# Patient Record
Sex: Male | Born: 1970 | Hispanic: No | Marital: Single | Smoking: Never smoker
Health system: Southern US, Community
[De-identification: ages and names within clinical notes are randomized; demographics above are authoritative.]

## PROBLEM LIST (undated history)

## (undated) DIAGNOSIS — K219 Gastro-esophageal reflux disease without esophagitis: Secondary | ICD-10-CM

---

## 2014-06-12 ENCOUNTER — Ambulatory Visit
Admission: RE | Admit: 2014-06-12 | Discharge: 2014-06-12 | Disposition: A | Discharge status: Home / Self Care | Payer: PRIVATE HEALTH INSURANCE | Source: Ambulatory Visit | Attending: Physical Medicine and Rehabilitation | Admitting: Physical Medicine and Rehabilitation

## 2014-06-12 ENCOUNTER — Other Ambulatory Visit: Payer: Self-pay | Admitting: Physical Medicine and Rehabilitation

## 2014-06-12 DIAGNOSIS — Z0000 Encounter for general adult medical examination without abnormal findings: Secondary | ICD-10-CM

## 2016-04-02 IMAGING — CR DG CHEST 2V
2 series · 2 of 2 positions shown · non-contrast
Comparison: None.

CLINICAL DATA: Employment physical.

EXAM:
CHEST  2 VIEW

[view not recorded (1 of 2)]
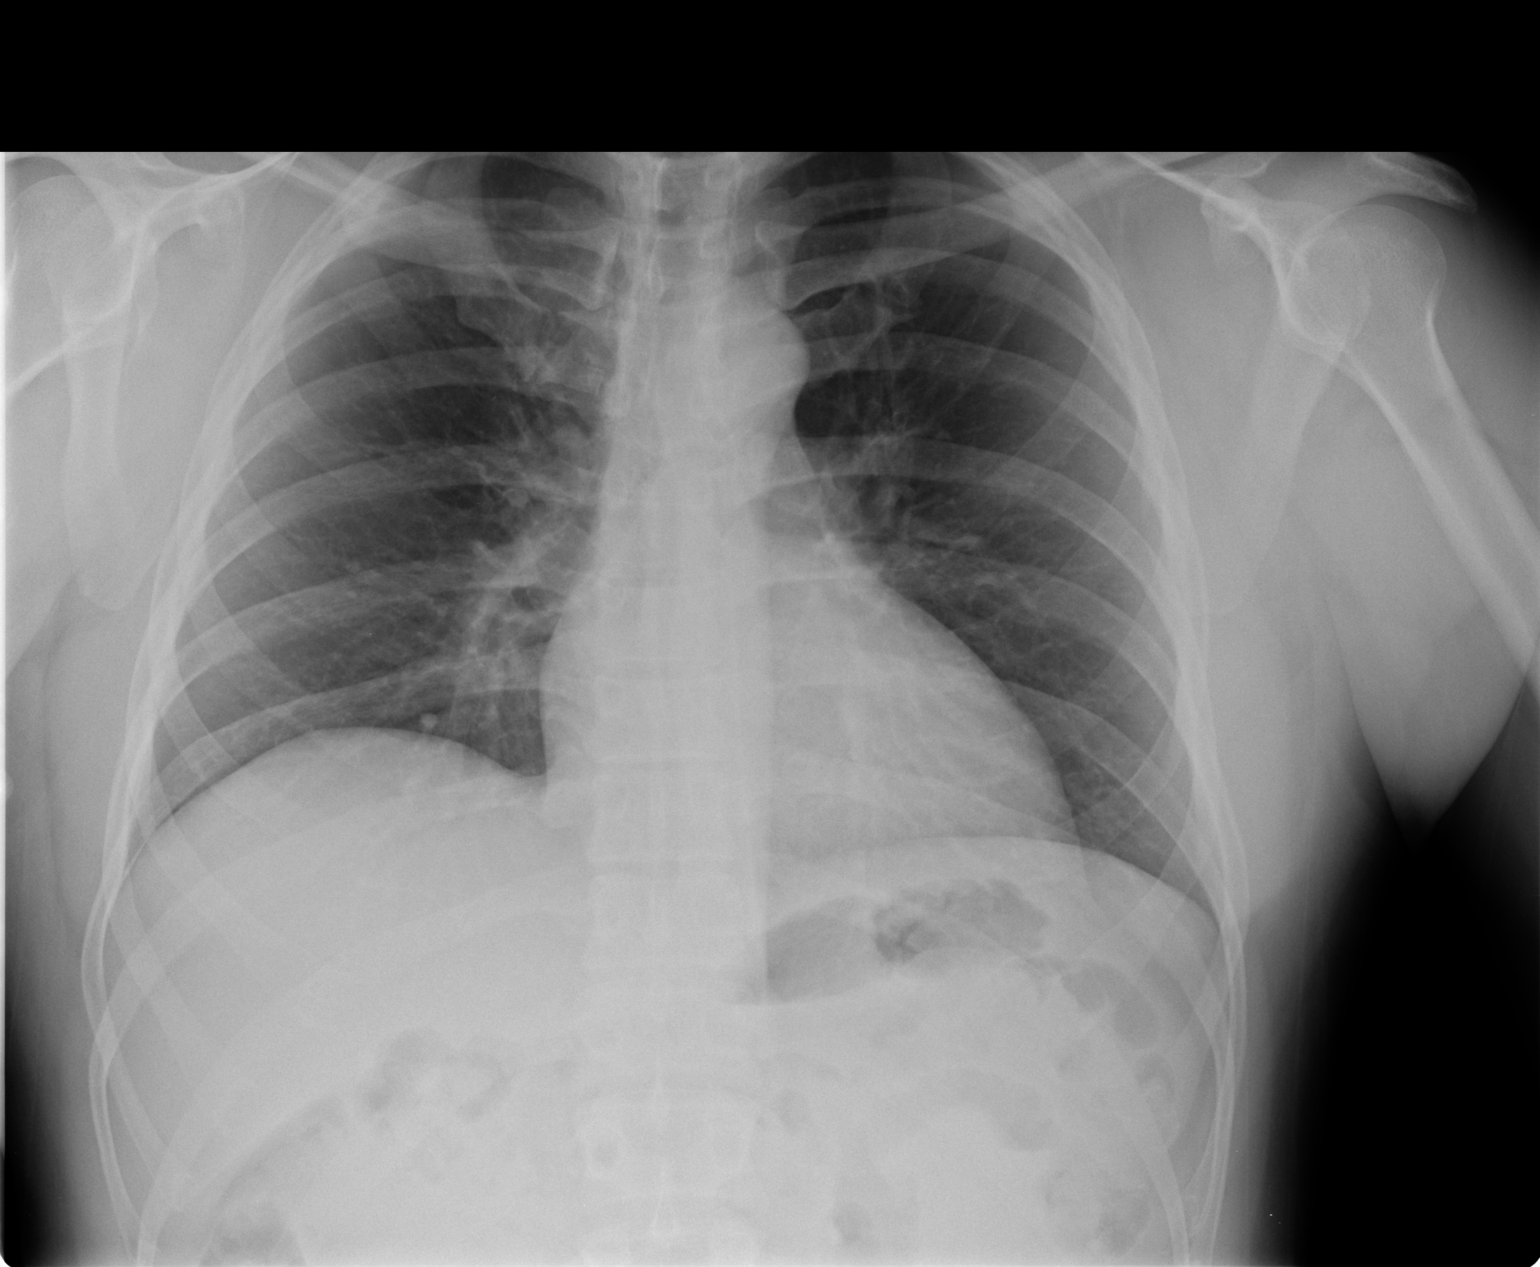

[view not recorded (2 of 2)]
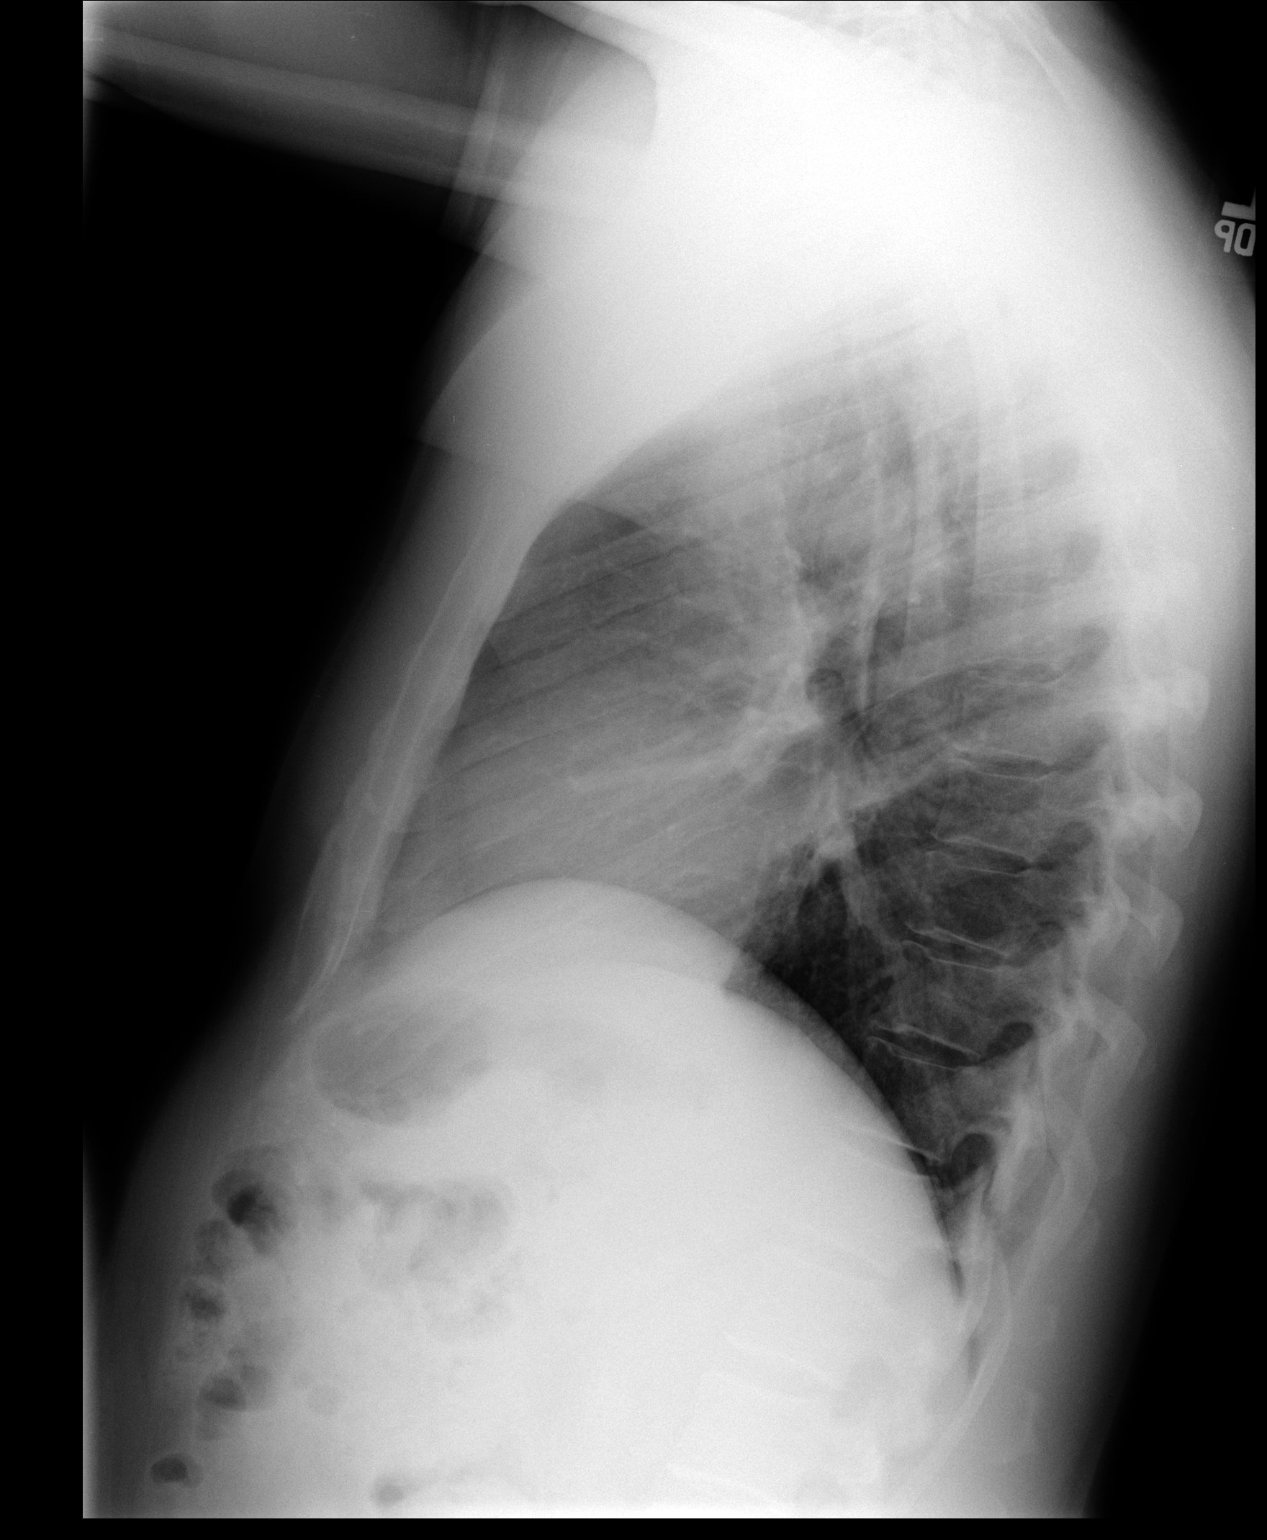

[2 of 2 positions shown; findings below may reference images not displayed]

FINDINGS: The heart size and mediastinal contours are within normal limits.
Both lungs are clear. Old right clavicle fracture. The visualized
skeletal structures are unremarkable.
IMPRESSION: No active cardiopulmonary disease.

## 2017-07-12 ENCOUNTER — Ambulatory Visit (HOSPITAL_COMMUNITY)
Admission: EM | Admit: 2017-07-12 | Discharge: 2017-07-12 | Disposition: A | Discharge status: Home / Self Care | Payer: 59 | Attending: Emergency Medicine | Admitting: Emergency Medicine

## 2017-07-12 ENCOUNTER — Encounter (HOSPITAL_COMMUNITY): Payer: Self-pay | Admitting: Emergency Medicine

## 2017-07-12 DIAGNOSIS — N50819 Testicular pain, unspecified: Secondary | ICD-10-CM

## 2017-07-12 DIAGNOSIS — N453 Epididymo-orchitis: Secondary | ICD-10-CM | POA: Diagnosis not present

## 2017-07-12 DIAGNOSIS — R361 Hematospermia: Secondary | ICD-10-CM

## 2017-07-12 MED ORDER — DOXYCYCLINE HYCLATE 100 MG PO CAPS: 100.0000 mg | ORAL_CAPSULE | Freq: Two times a day (BID) | ORAL | 0 refills | Status: DC

## 2017-07-12 NOTE — Discharge Instructions (Signed)
Read instructions regarding her diagnoses. Take the antibiotic as directed. Elevate the scrotum. Sometimes cold compresses will help with discomfort. For worsening, new symptoms or problems may return or go to the emergency department

## 2017-07-12 NOTE — ED Triage Notes (Signed)
Pt here for right sided testicle pain with some swelling x 3 days

## 2017-07-12 NOTE — ED Provider Notes (Signed)
[facility]    CSN: [id number] Arrival date & time: 07/12/17  1907     History   Chief Complaint Chief Complaint  Patient presents with  . Testicle Pain    HPI Barry Miller is a 46 y.o. male.   46 year old male complaining of pain in the right testicle for 3 days. States it is mildly swollen. In the past couple days he saw small amount of blood in the semen but none since. Denies dysuria or hematuria. Denies trauma.      History reviewed. No pertinent past medical history.  There are no active problems to display for this patient.   History reviewed. No pertinent surgical history.     Home Medications    Prior to Admission medications   Medication Sig Start Date End Date Taking? Authorizing Provider  doxycycline (VIBRAMYCIN) 100 MG capsule Take 1 capsule (100 mg total) 2 (two) times daily by mouth. 07/12/17   Hayden RasmussenMabe, Lisett Dirusso, NP    Family History History reviewed. No pertinent family history.  Social History Social History   Tobacco Use  . Smoking status: Never Smoker  . Smokeless tobacco: Never Used  Substance Use Topics  . Alcohol use: Yes  . Drug use: No     Allergies   Patient has no known allergies.   Review of Systems Review of Systems  Constitutional: Negative.   Genitourinary: Positive for testicular pain. Negative for discharge, dysuria and penile pain.  All other systems reviewed and are negative.    Physical Exam Triage Vital Signs ED Triage Vitals [07/12/17 1933]  Enc Vitals Group     BP (!) 141/98     Pulse Rate 90     Resp 18     Temp 99.4 F (37.4 C)     Temp Source Oral     SpO2 100 %     Weight      Height      Head Circumference      Peak Flow      Pain Score 4     Pain Loc      Pain Edu?      Excl. in GC?    No data found.  Updated Vital Signs BP (!) 141/98 (BP Location: Right Arm)   Pulse 90   Temp 99.4 F (37.4 C) (Oral)   Resp 18   SpO2 100%   Visual Acuity Right Eye Distance:   Left  Eye Distance:   Bilateral Distance:    Right Eye Near:   Left Eye Near:    Bilateral Near:     Physical Exam  Constitutional: He is oriented to person, place, and time. He appears well-developed and well-nourished. No distress.  Eyes: EOM are normal.  Neck: Neck supple.  Cardiovascular: Normal rate.  Pulmonary/Chest: Effort normal. No respiratory distress.  Genitourinary: Penis normal.  Genitourinary Comments: Right testicle enlarged, swollen and tender. Positioned vertically. Tenderness in the posterior and superior portion of the testicle. Left testicle of normal size and configuration and orientation. No tenderness.  Musculoskeletal: He exhibits no edema.  Neurological: He is alert and oriented to person, place, and time. He exhibits normal muscle tone.  Skin: Skin is warm and dry.  Psychiatric: He has a normal mood and affect.  Nursing note and vitals reviewed.    UC Treatments / Results  Labs (all labs ordered are listed, but only abnormal results are displayed) Labs Reviewed - No data to display  EKG  EKG Interpretation None  Radiology No results found.  Procedures Procedures (including critical care time)  Medications Ordered in UC Medications - No data to display   Initial Impression / Assessment and Plan / UC Course  I have reviewed the triage vital signs and the nursing notes.  Pertinent labs & imaging results that were available during my care of the patient were reviewed by me and considered in my medical decision making (see chart for details).     Read instructions regarding her diagnoses. Take the antibiotic as directed. Elevate the scrotum. Sometimes cold compresses will help with discomfort. For worsening, new symptoms or problems may return or go to the emergency department   Final Clinical Impressions(s) / UC Diagnoses   Final diagnoses:  Orchitis and epididymitis    ED Discharge Orders        Ordered    doxycycline (VIBRAMYCIN)  100 MG capsule  2 times daily     07/12/17 2131       Controlled Substance Prescriptions [place] Controlled Substance Registry consulted? Not Applicable   Hayden Rasmussen, NP 07/12/17 2133

## 2017-07-13 LAB — POCT URINALYSIS DIP (DEVICE)
BILIRUBIN URINE: NEGATIVE
Glucose, UA: NEGATIVE mg/dL
Ketones, ur: NEGATIVE mg/dL
NITRITE: NEGATIVE
Protein, ur: 30 mg/dL — AB
Specific Gravity, Urine: >=1.03 (ref 1.005–1.030)
Urobilinogen, UA: 1 mg/dL (ref 0.0–1.0)
pH: 6.5 (ref 5.0–8.0)

## 2021-04-21 DIAGNOSIS — M25511 Pain in right shoulder: Secondary | ICD-10-CM | POA: Insufficient documentation

## 2021-07-09 DIAGNOSIS — M25611 Stiffness of right shoulder, not elsewhere classified: Secondary | ICD-10-CM | POA: Insufficient documentation

## 2022-03-26 ENCOUNTER — Emergency Department (HOSPITAL_COMMUNITY)
Admission: EM | Admit: 2022-03-26 | Discharge: 2022-03-26 | Disposition: A | Discharge status: Home / Self Care | Payer: 59 | Attending: Emergency Medicine | Admitting: Emergency Medicine

## 2022-03-26 ENCOUNTER — Encounter (HOSPITAL_COMMUNITY): Payer: Self-pay

## 2022-03-26 ENCOUNTER — Other Ambulatory Visit: Payer: Self-pay

## 2022-03-26 DIAGNOSIS — R21 Rash and other nonspecific skin eruption: Secondary | ICD-10-CM

## 2022-03-26 DIAGNOSIS — L509 Urticaria, unspecified: Secondary | ICD-10-CM | POA: Diagnosis not present

## 2022-03-26 HISTORY — DX: Gastro-esophageal reflux disease without esophagitis: K21.9

## 2022-03-26 MED ORDER — PREDNISONE 20 MG PO TABS
60.0000 mg | ORAL_TABLET | Freq: Once | ORAL | Status: AC
  Administered 2022-03-26: 60 mg via ORAL
  Filled 2022-03-26: qty 3

## 2022-03-26 MED ORDER — DIPHENHYDRAMINE HCL 25 MG PO TABS: 25.0000 mg | ORAL_TABLET | Freq: Four times a day (QID) | ORAL | 0 refills | Status: DC | PRN

## 2022-03-26 MED ORDER — PREDNISONE 20 MG PO TABS: ORAL_TABLET | ORAL | 0 refills | Status: DC

## 2022-03-26 NOTE — ED Triage Notes (Addendum)
Pt reports with hives to his upper arms and both legs. Pt took a Benadryl last night and it helped. Pt reports working out in the yard yesterday.

## 2022-03-26 NOTE — ED Provider Notes (Signed)
[name]-EMERGENCY DEPT Provider Note   CSN: [id number] Arrival date & time: 03/26/22  9518     History  Chief Complaint  Patient presents with   Urticaria    Barry Miller is a 51 y.o. male.  The history is provided by the patient and medical records.  Urticaria   51 y.o. M presenting to the ED with rash to BUE.  States he was working in the yard today but denies using any new chemicals, bug bites, or tick bites.  He states last night after showering he noticed rash to BUE, pruritic.  He did take benadryl about 1 hour PTA which seemed to help but rash still present.  Denies SOB, difficulty swallowing, sensation of throat closing, etc.  No known allergies.  Home Medications Prior to Admission medications   Medication Sig Start Date End Date Taking? Authorizing Provider  diphenhydrAMINE (BENADRYL) 25 MG tablet Take 1 tablet (25 mg total) by mouth every 6 (six) hours as needed. 03/26/22  Yes Garlon Hatchet, PA-C  predniSONE (DELTASONE) 20 MG tablet Take 40 mg by mouth daily for 3 days, then 20mg  by mouth daily for 3 days, then 10mg  daily for 3 days 03/26/22  Yes , PA-C  doxycycline (VIBRAMYCIN) 100 MG capsule Take 1 capsule (100 mg total) 2 (two) times daily by mouth. 07/12/17   Garlon Hatchet, NP      Allergies    Patient has no known allergies.    Review of Systems   Review of Systems  Skin:  Positive for rash.  All other systems reviewed and are negative.   Physical Exam Updated Vital Signs BP (!) 179/127 (BP Location: Left Arm)   Pulse 85   Temp 98.4 F (36.9 C) (Oral)   Resp 19   Ht 5\' 8"  (1.727 m)   Wt 79.4 kg   SpO2 98%   BMI 26.61 kg/m   Physical Exam Vitals and nursing note reviewed.  Constitutional:      Appearance: He is well-developed.  HENT:     Head: Normocephalic and atraumatic.     Mouth/Throat:     Comments: No lip/tongue swelling, handling secretions well, no stridor Eyes:     Conjunctiva/sclera:  Conjunctivae normal.     Pupils: Pupils are equal, round, and reactive to light.  Cardiovascular:     Rate and Rhythm: Normal rate and regular rhythm.     Heart sounds: Normal heart sounds.  Pulmonary:     Effort: Pulmonary effort is normal.     Breath sounds: Normal breath sounds.  Abdominal:     General: Bowel sounds are normal.     Palpations: Abdomen is soft.  Musculoskeletal:        General: Normal range of motion.     Cervical back: Normal range of motion.  Skin:    General: Skin is warm and dry.     Comments: Urticarial rash noted to BUE, worse along dorsal surfaces, spares palms; no appreciable bug bites/stings  Neurological:     Mental Status: He is alert and oriented to person, place, and time.     ED Results / Procedures / Treatments   Labs (all labs ordered are listed, but only abnormal results are displayed) Labs Reviewed - No data to display  EKG None  Radiology No results found.  Procedures Procedures    Medications Ordered in ED Medications  predniSONE (DELTASONE) tablet 60 mg (60 mg Oral Given 03/26/22 0442)    ED Course/  Medical Decision Making/ A&P                           Medical Decision Making Risk OTC drugs. Prescription drug management.   51 year old male presenting to the ED with rash to upper arms that occurred last night.  Does admit to working in the yard yesterday but denies any new use of chemicals, bug or tick bites.  Did take Benadryl prior to arrival with some improvement but rash still present.  Afebrile, nontoxic.  Urticarial rash noted to bilateral upper extremities, worse along dorsal surfaces.  Rash spares palms.  No lip or tongue swelling, no airway compromise.  No visible bug bites or stings noted.  As she does have Benadryl, will give dose of prednisone, plan to discharge home with same.  Encouraged follow-up with PCP.  Return here for new concerns.  Final Clinical Impression(s) / ED Diagnoses Final diagnoses:  Rash     Rx / DC Orders ED Discharge Orders          Ordered    predniSONE (DELTASONE) 20 MG tablet        03/26/22 0502    diphenhydrAMINE (BENADRYL) 25 MG tablet  Every 6 hours PRN        03/26/22 0502              Garlon Hatchet, PA-C 03/26/22 0515    Geoffery Lyons, MD 03/26/22 [phone]

## 2022-03-26 NOTE — Discharge Instructions (Signed)
Take the prescribed medication as directed. °Follow-up with your primary care doctor. °Return to the ED for new or worsening symptoms. °

## 2022-08-17 ENCOUNTER — Encounter: Payer: Self-pay | Admitting: Emergency Medicine

## 2022-08-17 ENCOUNTER — Ambulatory Visit (INDEPENDENT_AMBULATORY_CARE_PROVIDER_SITE_OTHER): Payer: Self-pay | Admitting: Emergency Medicine

## 2022-08-17 VITALS — BP 178/98 | HR 99 | Temp 98.7°F | Ht 68.0 in | Wt 188.0 lb

## 2022-08-17 DIAGNOSIS — I10 Essential (primary) hypertension: Secondary | ICD-10-CM

## 2022-08-17 DIAGNOSIS — Z7689 Persons encountering health services in other specified circumstances: Secondary | ICD-10-CM

## 2022-08-17 DIAGNOSIS — Z1211 Encounter for screening for malignant neoplasm of colon: Secondary | ICD-10-CM

## 2022-08-17 LAB — COMPREHENSIVE METABOLIC PANEL
ALT: 20 U/L (ref 0–53)
AST: 18 U/L (ref 0–37)
Albumin: 4.5 g/dL (ref 3.5–5.2)
Alkaline Phosphatase: 88 U/L (ref 39–117)
BUN: 12 mg/dL (ref 6–23)
CO2: 31 mEq/L (ref 19–32)
Calcium: 9.5 mg/dL (ref 8.4–10.5)
Chloride: 103 mEq/L (ref 96–112)
Creatinine, Ser: 1.46 mg/dL (ref 0.40–1.50)
GFR: 55.64 mL/min — ABNORMAL LOW (ref >60.00)
Glucose, Bld: 91 mg/dL (ref 70–99)
Potassium: 4.3 mEq/L (ref 3.5–5.1)
Sodium: 139 mEq/L (ref 135–145)
Total Bilirubin: 0.6 mg/dL (ref 0.2–1.2)
Total Protein: 7.4 g/dL (ref 6.0–8.3)

## 2022-08-17 LAB — CBC WITH DIFFERENTIAL/PLATELET
Basophils Absolute: 0 10*3/uL (ref 0.0–0.1)
Basophils Relative: 0.9 % (ref 0.0–3.0)
Eosinophils Absolute: 0 10*3/uL (ref 0.0–0.7)
Eosinophils Relative: 1.4 % (ref 0.0–5.0)
HCT: 47.2 % (ref 39.0–52.0)
Hemoglobin: 16.1 g/dL (ref 13.0–17.0)
Lymphocytes Relative: 46 % (ref 12.0–46.0)
Lymphs Abs: 1.3 10*3/uL (ref 0.7–4.0)
MCHC: 34.1 g/dL (ref 30.0–36.0)
MCV: 91.2 fl (ref 78.0–100.0)
Monocytes Absolute: 0.3 10*3/uL (ref 0.1–1.0)
Monocytes Relative: 10.5 % (ref 3.0–12.0)
Neutro Abs: 1.2 10*3/uL — ABNORMAL LOW (ref 1.4–7.7)
Neutrophils Relative %: 41.2 % — ABNORMAL LOW (ref 43.0–77.0)
Platelets: 223 10*3/uL (ref 150.0–400.0)
RBC: 5.17 Mil/uL (ref 4.22–5.81)
RDW: 13.2 % (ref 11.5–15.5)
WBC: 2.9 10*3/uL — ABNORMAL LOW (ref 4.0–10.5)

## 2022-08-17 LAB — LIPID PANEL
Cholesterol: 251 mg/dL — ABNORMAL HIGH (ref 0–200)
HDL: 57.5 mg/dL (ref >39.00)
LDL Cholesterol: 165 mg/dL — ABNORMAL HIGH (ref 0–99)
NonHDL: 193.31
Total CHOL/HDL Ratio: 4
Triglycerides: 142 mg/dL (ref 0.0–149.0)
VLDL: 28.4 mg/dL (ref 0.0–40.0)

## 2022-08-17 LAB — HEMOGLOBIN A1C: Hgb A1c MFr Bld: 5.1 % (ref 4.6–6.5)

## 2022-08-17 MED ORDER — AMLODIPINE BESYLATE 5 MG PO TABS: 5.0000 mg | ORAL_TABLET | Freq: Every day | ORAL | 3 refills | Status: DC

## 2022-08-17 NOTE — Patient Instructions (Signed)
Hypertension, Adult High blood pressure (hypertension) is when the force of blood pumping through the arteries is too strong. The arteries are the blood vessels that carry blood from the heart throughout the body. Hypertension forces the heart to work harder to pump blood and may cause arteries to become narrow or stiff. Untreated or uncontrolled hypertension can lead to a heart attack, heart failure, a stroke, kidney disease, and other problems. A blood pressure reading consists of a higher number over a lower number. Ideally, your blood pressure should be below 120/80. The first ("top") number is called the systolic pressure. It is a measure of the pressure in your arteries as your heart beats. The second ("bottom") number is called the diastolic pressure. It is a measure of the pressure in your arteries as the heart relaxes. What are the causes? The exact cause of this condition is not known. There are some conditions that result in high blood pressure. What increases the risk? Certain factors may make you more likely to develop high blood pressure. Some of these risk factors are under your control, including: Smoking. Not getting enough exercise or physical activity. Being overweight. Having too much fat, sugar, calories, or salt (sodium) in your diet. Drinking too much alcohol. Other risk factors include: Having a personal history of heart disease, diabetes, high cholesterol, or kidney disease. Stress. Having a family history of high blood pressure and high cholesterol. Having obstructive sleep apnea. Age. The risk increases with age. What are the signs or symptoms? High blood pressure may not cause symptoms. Very high blood pressure (hypertensive crisis) may cause: Headache. Fast or irregular heartbeats (palpitations). Shortness of breath. Nosebleed. Nausea and vomiting. Vision changes. Severe chest pain, dizziness, and seizures. How is this diagnosed? This condition is diagnosed by  measuring your blood pressure while you are seated, with your arm resting on a flat surface, your legs uncrossed, and your feet flat on the floor. The cuff of the blood pressure monitor will be placed directly against the skin of your upper arm at the level of your heart. Blood pressure should be measured at least twice using the same arm. Certain conditions can cause a difference in blood pressure between your right and left arms. If you have a high blood pressure reading during one visit or you have normal blood pressure with other risk factors, you may be asked to: Return on a different day to have your blood pressure checked again. Monitor your blood pressure at home for 1 week or longer. If you are diagnosed with hypertension, you may have other blood or imaging tests to help your health care provider understand your overall risk for other conditions. How is this treated? This condition is treated by making healthy lifestyle changes, such as eating healthy foods, exercising more, and reducing your alcohol intake. You may be referred for counseling on a healthy diet and physical activity. Your health care provider may prescribe medicine if lifestyle changes are not enough to get your blood pressure under control and if: Your systolic blood pressure is above 130. Your diastolic blood pressure is above 80. Your personal target blood pressure may vary depending on your medical conditions, your age, and other factors. Follow these instructions at home: Eating and drinking  Eat a diet that is high in fiber and potassium, and low in sodium, added sugar, and fat. An example of this eating plan is called the DASH diet. DASH stands for Dietary Approaches to Stop Hypertension. To eat this way: Eat   plenty of fresh fruits and vegetables. Try to fill one half of your plate at each meal with fruits and vegetables. Eat whole grains, such as whole-wheat pasta, brown rice, or whole-grain bread. Fill about one  fourth of your plate with whole grains. Eat or drink low-fat dairy products, such as skim milk or low-fat yogurt. Avoid fatty cuts of meat, processed or cured meats, and poultry with skin. Fill about one fourth of your plate with lean proteins, such as fish, chicken without skin, beans, eggs, or tofu. Avoid pre-made and processed foods. These tend to be higher in sodium, added sugar, and fat. Reduce your daily sodium intake. Many people with hypertension should eat less than 1,500 mg of sodium a day. Do not drink alcohol if: Your health care provider tells you not to drink. You are pregnant, may be pregnant, or are planning to become pregnant. If you drink alcohol: Limit how much you have to: 0-1 drink a day for women. 0-2 drinks a day for men. Know how much alcohol is in your drink. In the [place] one drink equals one 12 oz bottle of beer (355 mL), one 5 oz glass of wine (148 mL), or one 1 oz glass of hard liquor (44 mL). Lifestyle  Work with your health care provider to maintain a healthy body weight or to lose weight. Ask what an ideal weight is for you. Get at least 30 minutes of exercise that causes your heart to beat faster (aerobic exercise) most days of the week. Activities may include walking, swimming, or biking. Include exercise to strengthen your muscles (resistance exercise), such as Pilates or lifting weights, as part of your weekly exercise routine. Try to do these types of exercises for 30 minutes at least 3 days a week. Do not use any products that contain nicotine or tobacco. These products include cigarettes, chewing tobacco, and vaping devices, such as e-cigarettes. If you need help quitting, ask your health care provider. Monitor your blood pressure at home as told by your health care provider. Keep all follow-up visits. This is important. Medicines Take over-the-counter and prescription medicines only as told by your health care provider. Follow directions carefully. Blood  pressure medicines must be taken as prescribed. Do not skip doses of blood pressure medicine. Doing this puts you at risk for problems and can make the medicine less effective. Ask your health care provider about side effects or reactions to medicines that you should watch for. Contact a health care provider if you: Think you are having a reaction to a medicine you are taking. Have headaches that keep coming back (recurring). Feel dizzy. Have swelling in your ankles. Have trouble with your vision. Get help right away if you: Develop a severe headache or confusion. Have unusual weakness or numbness. Feel faint. Have severe pain in your chest or abdomen. Vomit repeatedly. Have trouble breathing. These symptoms may be an emergency. Get help right away. Call 911. Do not wait to see if the symptoms will go away. Do not drive yourself to the hospital. Summary Hypertension is when the force of blood pumping through your arteries is too strong. If this condition is not controlled, it may put you at risk for serious complications. Your personal target blood pressure may vary depending on your medical conditions, your age, and other factors. For most people, a normal blood pressure is less than 120/80. Hypertension is treated with lifestyle changes, medicines, or a combination of both. Lifestyle changes include losing weight, eating a healthy,   low-sodium diet, exercising more, and limiting alcohol. This information is not intended to replace advice given to you by your health care provider. Make sure you discuss any questions you have with your health care provider. Document Revised: 07/01/2021 Document Reviewed: 07/01/2021 Elsevier Patient Education  2023 [employer]  

## 2022-08-17 NOTE — Assessment & Plan Note (Signed)
Elevated blood pressure readings in the office and at home. Cardio vascular risks associated with uncontrolled hypertension discussed.  Family history of hypertension. Dietary approaches to stop hypertension discussed. Recommend to start amlodipine 5 mg daily. Advised to continue monitoring blood pressure readings at home daily for the next several weeks and keep a log.  Advised to contact the office if numbers persistently abnormal. Follow-up in 3 months.  Earlier as needed.

## 2022-08-17 NOTE — Progress Notes (Signed)
Barry ReddishAlexander Ciccarelli 51 y.o.   Chief Complaint  Patient presents with   New patient    No concerns or questions    HISTORY OF PRESENT ILLNESS: This is a 51 y.o. male A1A first visit to this office, here to establish care with me. Has noticed blood pressure readings persistently high at home. Family history of hypertension. Non-smoker. No other complaints or medical concerns today. BP Readings from Last 3 Encounters:  08/17/22 (!) 178/98  03/26/22 (!) 179/127  07/12/17 (!) 141/98     HPI   Prior to Admission medications   Not on File    No Known Allergies  There are no problems to display for this patient.   Past Medical History:  Diagnosis Date   GERD (gastroesophageal reflux disease)     No past surgical history on file.  Social History   Socioeconomic History   Marital status: Single    Spouse name: Not on file   Number of children: Not on file   Years of education: Not on file   Highest education level: Not on file  Occupational History   Not on file  Tobacco Use   Smoking status: Never   Smokeless tobacco: Never  Substance and Sexual Activity   Alcohol use: Yes   Drug use: No   Sexual activity: Not on file  Other Topics Concern   Not on file  Social History Narrative   Not on file   Social Determinants of Health   Financial Resource Strain: Not on file  Food Insecurity: Not on file  Transportation Needs: Not on file  Physical Activity: Not on file  Stress: Not on file  Social Connections: Not on file  Intimate Partner Violence: Not on file    No family history on file.   Review of Systems  Constitutional: Negative.   HENT: Negative.  Negative for congestion and sore throat.   Eyes: Negative.   Respiratory: Negative.  Negative for cough and shortness of breath.   Cardiovascular: Negative.  Negative for chest pain and palpitations.  Gastrointestinal:  Positive for heartburn. Negative for abdominal pain, diarrhea, nausea and vomiting.   Genitourinary: Negative.   Skin: Negative.  Negative for rash.  Neurological: Negative.  Negative for dizziness and headaches.  All other systems reviewed and are negative.   Today's Vitals   08/17/22 1448 08/17/22 1455  BP: (!) 182/108 (!) 178/98  Pulse: 99   Temp: 98.7 F (37.1 C)   TempSrc: Oral   SpO2: 96%   Weight: 188 lb (85.3 kg)   Height: 5\' 8"  (1.727 m)    Body mass index is 28.59 kg/m.  Physical Exam Vitals reviewed.  Constitutional:      Appearance: Normal appearance.  HENT:     Head: Normocephalic.     Mouth/Throat:     Mouth: Mucous membranes are moist.     Pharynx: Oropharynx is clear.  Eyes:     Extraocular Movements: Extraocular movements intact.     Conjunctiva/sclera: Conjunctivae normal.     Pupils: Pupils are equal, round, and reactive to light.  Cardiovascular:     Rate and Rhythm: Normal rate and regular rhythm.     Pulses: Normal pulses.     Heart sounds: Normal heart sounds.  Pulmonary:     Effort: Pulmonary effort is normal.     Breath sounds: Normal breath sounds.  Abdominal:     Palpations: Abdomen is soft.     Tenderness: There is no abdominal tenderness.  Musculoskeletal:  Cervical back: No tenderness.  Lymphadenopathy:     Cervical: No cervical adenopathy.  Skin:    General: Skin is warm and dry.     Capillary Refill: Capillary refill takes less than 2 seconds.  Neurological:     General: No focal deficit present.     Mental Status: He is alert and oriented to person, place, and time.  Psychiatric:        Mood and Affect: Mood normal.        Behavior: Behavior normal.      ASSESSMENT & PLAN: A total of 47 minutes was spent with the patient and counseling/coordination of care regarding preparing for this visit, establishing care with me, review of available medical records, diagnosis of hypertension and cardiovascular risks associated with this condition, education on nutrition, need for blood work today, need to start  antihypertensive medication today, prognosis, documentation, need for follow-up  Problem List Items Addressed This Visit       Cardiovascular and Mediastinum   Essential hypertension - Primary    Elevated blood pressure readings in the office and at home. Cardio vascular risks associated with uncontrolled hypertension discussed.  Family history of hypertension. Dietary approaches to stop hypertension discussed. Recommend to start amlodipine 5 mg daily. Advised to continue monitoring blood pressure readings at home daily for the next several weeks and keep a log.  Advised to contact the office if numbers persistently abnormal. Follow-up in 3 months.  Earlier as needed.      Relevant Medications   amLODipine (NORVASC) 5 MG tablet   Other Relevant Orders   CBC with Differential/Platelet   Comprehensive metabolic panel   Hemoglobin A1c   Lipid panel   Other Visit Diagnoses     Colon cancer screening       Relevant Orders   Cologuard   Encounter to establish care          Patient Instructions  Hypertension, Adult High blood pressure (hypertension) is when the force of blood pumping through the arteries is too strong. The arteries are the blood vessels that carry blood from the heart throughout the body. Hypertension forces the heart to work harder to pump blood and may cause arteries to become narrow or stiff. Untreated or uncontrolled hypertension can lead to a heart attack, heart failure, a stroke, kidney disease, and other problems. A blood pressure reading consists of a higher number over a lower number. Ideally, your blood pressure should be below 120/80. The first ("top") number is called the systolic pressure. It is a measure of the pressure in your arteries as your heart beats. The second ("bottom") number is called the diastolic pressure. It is a measure of the pressure in your arteries as the heart relaxes. What are the causes? The exact cause of this condition is not known.  There are some conditions that result in high blood pressure. What increases the risk? Certain factors may make you more likely to develop high blood pressure. Some of these risk factors are under your control, including: Smoking. Not getting enough exercise or physical activity. Being overweight. Having too much fat, sugar, calories, or salt (sodium) in your diet. Drinking too much alcohol. Other risk factors include: Having a personal history of heart disease, diabetes, high cholesterol, or kidney disease. Stress. Having a family history of high blood pressure and high cholesterol. Having obstructive sleep apnea. Age. The risk increases with age. What are the signs or symptoms? High blood pressure may not cause symptoms. Very high  blood pressure (hypertensive crisis) may cause: Headache. Fast or irregular heartbeats (palpitations). Shortness of breath. Nosebleed. Nausea and vomiting. Vision changes. Severe chest pain, dizziness, and seizures. How is this diagnosed? This condition is diagnosed by measuring your blood pressure while you are seated, with your arm resting on a flat surface, your legs uncrossed, and your feet flat on the floor. The cuff of the blood pressure monitor will be placed directly against the skin of your upper arm at the level of your heart. Blood pressure should be measured at least twice using the same arm. Certain conditions can cause a difference in blood pressure between your right and left arms. If you have a high blood pressure reading during one visit or you have normal blood pressure with other risk factors, you may be asked to: Return on a different day to have your blood pressure checked again. Monitor your blood pressure at home for 1 week or longer. If you are diagnosed with hypertension, you may have other blood or imaging tests to help your health care provider understand your overall risk for other conditions. How is this treated? This condition  is treated by making healthy lifestyle changes, such as eating healthy foods, exercising more, and reducing your alcohol intake. You may be referred for counseling on a healthy diet and physical activity. Your health care provider may prescribe medicine if lifestyle changes are not enough to get your blood pressure under control and if: Your systolic blood pressure is above 130. Your diastolic blood pressure is above 80. Your personal target blood pressure may vary depending on your medical conditions, your age, and other factors. Follow these instructions at home: Eating and drinking  Eat a diet that is high in fiber and potassium, and low in sodium, added sugar, and fat. An example of this eating plan is called the DASH diet. DASH stands for Dietary Approaches to Stop Hypertension. To eat this way: Eat plenty of fresh fruits and vegetables. Try to fill one half of your plate at each meal with fruits and vegetables. Eat whole grains, such as whole-wheat pasta, brown rice, or whole-grain bread. Fill about one fourth of your plate with whole grains. Eat or drink low-fat dairy products, such as skim milk or low-fat yogurt. Avoid fatty cuts of meat, processed or cured meats, and poultry with skin. Fill about one fourth of your plate with lean proteins, such as fish, chicken without skin, beans, eggs, or tofu. Avoid pre-made and processed foods. These tend to be higher in sodium, added sugar, and fat. Reduce your daily sodium intake. Many people with hypertension should eat less than 1,500 mg of sodium a day. Do not drink alcohol if: Your health care provider tells you not to drink. You are pregnant, may be pregnant, or are planning to become pregnant. If you drink alcohol: Limit how much you have to: 0-1 drink a day for women. 0-2 drinks a day for men. Know how much alcohol is in your drink. In the [place] one drink equals one 12 oz bottle of beer (355 mL), one 5 oz glass of wine (148 mL), or one 1  oz glass of hard liquor (44 mL). Lifestyle  Work with your health care provider to maintain a healthy body weight or to lose weight. Ask what an ideal weight is for you. Get at least 30 minutes of exercise that causes your heart to beat faster (aerobic exercise) most days of the week. Activities may include walking, swimming, or biking. Include  exercise to strengthen your muscles (resistance exercise), such as Pilates or lifting weights, as part of your weekly exercise routine. Try to do these types of exercises for 30 minutes at least 3 days a week. Do not use any products that contain nicotine or tobacco. These products include cigarettes, chewing tobacco, and vaping devices, such as e-cigarettes. If you need help quitting, ask your health care provider. Monitor your blood pressure at home as told by your health care provider. Keep all follow-up visits. This is important. Medicines Take over-the-counter and prescription medicines only as told by your health care provider. Follow directions carefully. Blood pressure medicines must be taken as prescribed. Do not skip doses of blood pressure medicine. Doing this puts you at risk for problems and can make the medicine less effective. Ask your health care provider about side effects or reactions to medicines that you should watch for. Contact a health care provider if you: Think you are having a reaction to a medicine you are taking. Have headaches that keep coming back (recurring). Feel dizzy. Have swelling in your ankles. Have trouble with your vision. Get help right away if you: Develop a severe headache or confusion. Have unusual weakness or numbness. Feel faint. Have severe pain in your chest or abdomen. Vomit repeatedly. Have trouble breathing. These symptoms may be an emergency. Get help right away. Call 911. Do not wait to see if the symptoms will go away. Do not drive yourself to the hospital. Summary Hypertension is when the  force of blood pumping through your arteries is too strong. If this condition is not controlled, it may put you at risk for serious complications. Your personal target blood pressure may vary depending on your medical conditions, your age, and other factors. For most people, a normal blood pressure is less than 120/80. Hypertension is treated with lifestyle changes, medicines, or a combination of both. Lifestyle changes include losing weight, eating a healthy, low-sodium diet, exercising more, and limiting alcohol. This information is not intended to replace advice given to you by your health care provider. Make sure you discuss any questions you have with your health care provider. Document Revised: 07/01/2021 Document Reviewed: 07/01/2021 Elsevier Patient Education  2023 [employer].     Edwina Barth, MD [facility]

## 2022-08-18 ENCOUNTER — Other Ambulatory Visit: Payer: Self-pay | Admitting: Emergency Medicine

## 2022-08-18 DIAGNOSIS — E785 Hyperlipidemia, unspecified: Secondary | ICD-10-CM

## 2022-08-18 MED ORDER — ROSUVASTATIN CALCIUM 10 MG PO TABS: 10.0000 mg | ORAL_TABLET | Freq: Every day | ORAL | 3 refills | Status: AC

## 2022-09-08 LAB — COLOGUARD: COLOGUARD: NEGATIVE

## 2023-01-27 ENCOUNTER — Encounter: Payer: Self-pay | Admitting: Emergency Medicine

## 2023-01-27 ENCOUNTER — Ambulatory Visit (INDEPENDENT_AMBULATORY_CARE_PROVIDER_SITE_OTHER): Payer: BC Managed Care – PPO

## 2023-01-27 ENCOUNTER — Ambulatory Visit: Payer: BC Managed Care – PPO | Admitting: Emergency Medicine

## 2023-01-27 VITALS — BP 138/86 | HR 95 | Temp 98.3°F | Ht 68.0 in | Wt 196.1 lb

## 2023-01-27 DIAGNOSIS — R053 Chronic cough: Secondary | ICD-10-CM | POA: Diagnosis not present

## 2023-01-27 DIAGNOSIS — E785 Hyperlipidemia, unspecified: Secondary | ICD-10-CM | POA: Diagnosis not present

## 2023-01-27 DIAGNOSIS — D709 Neutropenia, unspecified: Secondary | ICD-10-CM

## 2023-01-27 DIAGNOSIS — I10 Essential (primary) hypertension: Secondary | ICD-10-CM

## 2023-01-27 DIAGNOSIS — R944 Abnormal results of kidney function studies: Secondary | ICD-10-CM

## 2023-01-27 DIAGNOSIS — R059 Cough, unspecified: Secondary | ICD-10-CM | POA: Diagnosis not present

## 2023-01-27 LAB — LIPID PANEL
Cholesterol: 251 mg/dL — ABNORMAL HIGH (ref 0–200)
HDL: 48.3 mg/dL (ref >39.00)
NonHDL: 202.49
Total CHOL/HDL Ratio: 5
Triglycerides: 257 mg/dL — ABNORMAL HIGH (ref 0.0–149.0)
VLDL: 51.4 mg/dL — ABNORMAL HIGH (ref 0.0–40.0)

## 2023-01-27 LAB — COMPREHENSIVE METABOLIC PANEL
ALT: 29 U/L (ref 0–53)
AST: 21 U/L (ref 0–37)
Albumin: 4.3 g/dL (ref 3.5–5.2)
Alkaline Phosphatase: 86 U/L (ref 39–117)
BUN: 14 mg/dL (ref 6–23)
CO2: 28 mEq/L (ref 19–32)
Calcium: 9.2 mg/dL (ref 8.4–10.5)
Chloride: 102 mEq/L (ref 96–112)
Creatinine, Ser: 1.06 mg/dL (ref 0.40–1.50)
GFR: 81.45 mL/min (ref >60.00)
Glucose, Bld: 105 mg/dL — ABNORMAL HIGH (ref 70–99)
Potassium: 4 mEq/L (ref 3.5–5.1)
Sodium: 137 mEq/L (ref 135–145)
Total Bilirubin: 0.4 mg/dL (ref 0.2–1.2)
Total Protein: 7.1 g/dL (ref 6.0–8.3)

## 2023-01-27 LAB — CBC WITH DIFFERENTIAL/PLATELET
Basophils Absolute: 0 10*3/uL (ref 0.0–0.1)
Basophils Relative: 0.5 % (ref 0.0–3.0)
Eosinophils Absolute: 0.1 10*3/uL (ref 0.0–0.7)
Eosinophils Relative: 2.1 % (ref 0.0–5.0)
HCT: 41.9 % (ref 39.0–52.0)
Hemoglobin: 13.9 g/dL (ref 13.0–17.0)
Lymphocytes Relative: 44.7 % (ref 12.0–46.0)
Lymphs Abs: 1.5 10*3/uL (ref 0.7–4.0)
MCHC: 33.1 g/dL (ref 30.0–36.0)
MCV: 91.8 fl (ref 78.0–100.0)
Monocytes Absolute: 0.4 10*3/uL (ref 0.1–1.0)
Monocytes Relative: 10.8 % (ref 3.0–12.0)
Neutro Abs: 1.4 10*3/uL (ref 1.4–7.7)
Neutrophils Relative %: 41.9 % — ABNORMAL LOW (ref 43.0–77.0)
Platelets: 229 10*3/uL (ref 150.0–400.0)
RBC: 4.57 Mil/uL (ref 4.22–5.81)
RDW: 13.3 % (ref 11.5–15.5)
WBC: 3.3 10*3/uL — ABNORMAL LOW (ref 4.0–10.5)

## 2023-01-27 LAB — LDL CHOLESTEROL, DIRECT: Direct LDL: 156 mg/dL

## 2023-01-27 MED ORDER — VALSARTAN-HYDROCHLOROTHIAZIDE 160-12.5 MG PO TABS: 1.0000 | ORAL_TABLET | Freq: Every day | ORAL | 3 refills | Status: DC

## 2023-01-27 NOTE — Assessment & Plan Note (Signed)
Uncertain etiology.  Non-smoker.  No history of asthma. Normal chest x-ray done today. Patient thinks this is secondary to amlodipine. Blood pressure medication changed today.

## 2023-01-27 NOTE — Assessment & Plan Note (Signed)
Abnormal lipid profile 6 months ago Not taking rosuvastatin.  States medication made his blood pressure go up Lipid profile repeated today Diet and nutrition discussed Benefits of exercise discussed

## 2023-01-27 NOTE — Patient Instructions (Signed)
Stop amlodipine Start valsartan HCT Continue monitoring blood pressure readings at home daily for the next several weeks and keep a log  Hypertension, Adult High blood pressure (hypertension) is when the force of blood pumping through the arteries is too strong. The arteries are the blood vessels that carry blood from the heart throughout the body. Hypertension forces the heart to work harder to pump blood and may cause arteries to become narrow or stiff. Untreated or uncontrolled hypertension can lead to a heart attack, heart failure, a stroke, kidney disease, and other problems. A blood pressure reading consists of a higher number over a lower number. Ideally, your blood pressure should be below 120/80. The first ("top") number is called the systolic pressure. It is a measure of the pressure in your arteries as your heart beats. The second ("bottom") number is called the diastolic pressure. It is a measure of the pressure in your arteries as the heart relaxes. What are the causes? The exact cause of this condition is not known. There are some conditions that result in high blood pressure. What increases the risk? Certain factors may make you more likely to develop high blood pressure. Some of these risk factors are under your control, including: Smoking. Not getting enough exercise or physical activity. Being overweight. Having too much fat, sugar, calories, or salt (sodium) in your diet. Drinking too much alcohol. Other risk factors include: Having a personal history of heart disease, diabetes, high cholesterol, or kidney disease. Stress. Having a family history of high blood pressure and high cholesterol. Having obstructive sleep apnea. Age. The risk increases with age. What are the signs or symptoms? High blood pressure may not cause symptoms. Very high blood pressure (hypertensive crisis) may cause: Headache. Fast or irregular heartbeats (palpitations). Shortness of  breath. Nosebleed. Nausea and vomiting. Vision changes. Severe chest pain, dizziness, and seizures. How is this diagnosed? This condition is diagnosed by measuring your blood pressure while you are seated, with your arm resting on a flat surface, your legs uncrossed, and your feet flat on the floor. The cuff of the blood pressure monitor will be placed directly against the skin of your upper arm at the level of your heart. Blood pressure should be measured at least twice using the same arm. Certain conditions can cause a difference in blood pressure between your right and left arms. If you have a high blood pressure reading during one visit or you have normal blood pressure with other risk factors, you may be asked to: Return on a different day to have your blood pressure checked again. Monitor your blood pressure at home for 1 week or longer. If you are diagnosed with hypertension, you may have other blood or imaging tests to help your health care provider understand your overall risk for other conditions. How is this treated? This condition is treated by making healthy lifestyle changes, such as eating healthy foods, exercising more, and reducing your alcohol intake. You may be referred for counseling on a healthy diet and physical activity. Your health care provider may prescribe medicine if lifestyle changes are not enough to get your blood pressure under control and if: Your systolic blood pressure is above 130. Your diastolic blood pressure is above 80. Your personal target blood pressure may vary depending on your medical conditions, your age, and other factors. Follow these instructions at home: Eating and drinking  Eat a diet that is high in fiber and potassium, and low in sodium, added sugar, and fat. An  example of this eating plan is called the DASH diet. DASH stands for Dietary Approaches to Stop Hypertension. To eat this way: Eat plenty of fresh fruits and vegetables. Try to fill  one half of your plate at each meal with fruits and vegetables. Eat whole grains, such as whole-wheat pasta, brown rice, or whole-grain bread. Fill about one fourth of your plate with whole grains. Eat or drink low-fat dairy products, such as skim milk or low-fat yogurt. Avoid fatty cuts of meat, processed or cured meats, and poultry with skin. Fill about one fourth of your plate with lean proteins, such as fish, chicken without skin, beans, eggs, or tofu. Avoid pre-made and processed foods. These tend to be higher in sodium, added sugar, and fat. Reduce your daily sodium intake. Many people with hypertension should eat less than 1,500 mg of sodium a day. Do not drink alcohol if: Your health care provider tells you not to drink. You are pregnant, may be pregnant, or are planning to become pregnant. If you drink alcohol: Limit how much you have to: 0-1 drink a day for women. 0-2 drinks a day for men. Know how much alcohol is in your drink. In the [place] one drink equals one 12 oz bottle of beer (355 mL), one 5 oz glass of wine (148 mL), or one 1 oz glass of hard liquor (44 mL). Lifestyle  Work with your health care provider to maintain a healthy body weight or to lose weight. Ask what an ideal weight is for you. Get at least 30 minutes of exercise that causes your heart to beat faster (aerobic exercise) most days of the week. Activities may include walking, swimming, or biking. Include exercise to strengthen your muscles (resistance exercise), such as Pilates or lifting weights, as part of your weekly exercise routine. Try to do these types of exercises for 30 minutes at least 3 days a week. Do not use any products that contain nicotine or tobacco. These products include cigarettes, chewing tobacco, and vaping devices, such as e-cigarettes. If you need help quitting, ask your health care provider. Monitor your blood pressure at home as told by your health care provider. Keep all follow-up visits.  This is important. Medicines Take over-the-counter and prescription medicines only as told by your health care provider. Follow directions carefully. Blood pressure medicines must be taken as prescribed. Do not skip doses of blood pressure medicine. Doing this puts you at risk for problems and can make the medicine less effective. Ask your health care provider about side effects or reactions to medicines that you should watch for. Contact a health care provider if you: Think you are having a reaction to a medicine you are taking. Have headaches that keep coming back (recurring). Feel dizzy. Have swelling in your ankles. Have trouble with your vision. Get help right away if you: Develop a severe headache or confusion. Have unusual weakness or numbness. Feel faint. Have severe pain in your chest or abdomen. Vomit repeatedly. Have trouble breathing. These symptoms may be an emergency. Get help right away. Call 911. Do not wait to see if the symptoms will go away. Do not drive yourself to the hospital. Summary Hypertension is when the force of blood pumping through your arteries is too strong. If this condition is not controlled, it may put you at risk for serious complications. Your personal target blood pressure may vary depending on your medical conditions, your age, and other factors. For most people, a normal blood pressure is  less than 120/80. Hypertension is treated with lifestyle changes, medicines, or a combination of both. Lifestyle changes include losing weight, eating a healthy, low-sodium diet, exercising more, and limiting alcohol. This information is not intended to replace advice given to you by your health care provider. Make sure you discuss any questions you have with your health care provider. Document Revised: 07/01/2021 Document Reviewed: 07/01/2021 Elsevier Patient Education  2023 [employer].

## 2023-01-27 NOTE — Assessment & Plan Note (Signed)
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

## 2023-01-27 NOTE — Progress Notes (Unsigned)
Barry Miller 52 y.o.   Chief Complaint  Patient presents with   Medical Management of Chronic Issues    F/u appt, patient has a dry cough for about a few months     HISTORY OF PRESENT ILLNESS: This is a 52 y.o. male here for follow-up of tremor and dyslipidemia Blood pressure numbers doing okay.  However patient thinks amlodipine is causing chronic cough Complaining of dry cough for the past 2 months.  Non-smoker.  No history of asthma. Also states that rosuvastatin made blood pressure go up.  Not taking it at present time No other complaints or medical concerns today. BP Readings from Last 3 Encounters:  01/27/23 138/86  08/17/22 (!) 178/98  03/26/22 (!) 179/127     HPI   Prior to Admission medications   Medication Sig Start Date End Date Taking? Authorizing Provider  amLODipine (NORVASC) 5 MG tablet Take 1 tablet (5 mg total) by mouth daily. 08/17/22  Yes Christino Mcglinchey, Eilleen Kempf, MD  rosuvastatin (CRESTOR) 10 MG tablet Take 1 tablet (10 mg total) by mouth daily. Patient not taking: Reported on 01/27/2023 08/18/22   Georgina Quint, MD    No Known Allergies  Patient Active Problem List   Diagnosis Date Noted   Dyslipidemia 08/18/2022   Essential hypertension 08/17/2022    Past Medical History:  Diagnosis Date   GERD (gastroesophageal reflux disease)     No past surgical history on file.  Social History   Socioeconomic History   Marital status: Single    Spouse name: Not on file   Number of children: Not on file   Years of education: Not on file   Highest education level: Not on file  Occupational History   Not on file  Tobacco Use   Smoking status: Never   Smokeless tobacco: Never  Substance and Sexual Activity   Alcohol use: Yes   Drug use: No   Sexual activity: Not on file  Other Topics Concern   Not on file  Social History Narrative   Not on file   Social Determinants of Health   Financial Resource Strain: Not on file  Food  Insecurity: Not on file  Transportation Needs: Not on file  Physical Activity: Not on file  Stress: Not on file  Social Connections: Not on file  Intimate Partner Violence: Not on file    No family history on file.   Review of Systems  Constitutional: Negative.  Negative for chills and fever.  HENT: Negative.  Negative for congestion and sore throat.   Respiratory:  Positive for cough.   Cardiovascular: Negative.  Negative for chest pain and palpitations.  Gastrointestinal:  Negative for abdominal pain, diarrhea, heartburn, nausea and vomiting.  Genitourinary: Negative.  Negative for dysuria and hematuria.  Skin: Negative.   Neurological: Negative.  Negative for dizziness and headaches.    Vitals:   01/27/23 1552  BP: 138/86  Pulse: 95  Temp: 98.3 F (36.8 C)  SpO2: 95%    Physical Exam Vitals reviewed.  Constitutional:      Appearance: Normal appearance.  HENT:     Head: Normocephalic.  Eyes:     Extraocular Movements: Extraocular movements intact.     Pupils: Pupils are equal, round, and reactive to light.  Cardiovascular:     Rate and Rhythm: Normal rate and regular rhythm.     Pulses: Normal pulses.     Heart sounds: Normal heart sounds.  Pulmonary:     Effort: Pulmonary effort is normal.  Breath sounds: Normal breath sounds.  Musculoskeletal:     Cervical back: No tenderness.  Lymphadenopathy:     Cervical: No cervical adenopathy.  Skin:    General: Skin is warm and dry.     Capillary Refill: Capillary refill takes less than 2 seconds.  Neurological:     Mental Status: He is alert and oriented to person, place, and time.  Psychiatric:        Mood and Affect: Mood normal.        Behavior: Behavior normal.    DG Chest 2 View  Result Date: 01/27/2023 CLINICAL DATA:  Dry cough for 2 months. EXAM: CHEST - 2 VIEW COMPARISON:  06/12/2014 FINDINGS: Heart size and mediastinal contours are unremarkable. Asymmetric elevation of the right hemidiaphragm. No  pleural effusion or interstitial edema. No airspace disease. Remote healed right clavicle fracture. No acute osseous findings. IMPRESSION: 1. No acute cardiopulmonary abnormalities. 2. Asymmetric elevation of right hemidiaphragm. Electronically Signed   By: Signa Kell M.D.   On: 01/27/2023 16:32     ASSESSMENT & PLAN: A total of 45 minutes was spent with the patient and counseling/coordination of care regarding preparing for this visit, review of most recent office visit notes, review of most recent blood work results, diagnosis of hypertension and cardiovascular risk associated with this condition, need for repeat blood work today, education on nutrition, review of chest x-ray report done today, differential diagnosis of chronic cough, prognosis, documentation and need for follow-up.  Problem List Items Addressed This Visit       Cardiovascular and Mediastinum   Essential hypertension - Primary    Well-controlled hypertension.  However patient thinks amlodipine is causing his chronic cough and prefers to change medication. Recommend to stop amlodipine and start valsartan HCT 160-12.5 mg daily. Advised to continue monitoring blood pressure readings at home daily for the next couple of weeks and contact the office if numbers persistently abnormal Dietary approaches to stop hypertension discussed Follow-up in 3 months      Relevant Medications   valsartan-hydrochlorothiazide (DIOVAN-HCT) 160-12.5 MG tablet   Other Relevant Orders   CBC with Differential/Platelet   Comprehensive metabolic panel   Lipid panel     Other   Dyslipidemia    Abnormal lipid profile 6 months ago Not taking rosuvastatin.  States medication made his blood pressure go up Lipid profile repeated today Diet and nutrition discussed Benefits of exercise discussed      Relevant Orders   CBC with Differential/Platelet   Comprehensive metabolic panel   Lipid panel   Chronic cough    Uncertain etiology.   Non-smoker.  No history of asthma. Normal chest x-ray done today. Patient thinks this is secondary to amlodipine. Blood pressure medication changed today.      Relevant Orders   DG Chest 2 View (Completed)   Neutropenia (HCC)    Lab Results  Component Value Date   WBC 2.9 (L) 08/17/2022   HGB 16.1 08/17/2022   HCT 47.2 08/17/2022   MCV 91.2 08/17/2022   PLT 223.0 08/17/2022  Neutropenia December 2023 Will repeat today Clinically asymptomatic       Relevant Orders   CBC with Differential/Platelet   Decreased GFR    GFR 55.12 August 2022 We will repeat today Advised to stay well-hydrated and avoid NSAIDs      Relevant Orders   Comprehensive metabolic panel   Patient Instructions  Stop amlodipine Start valsartan HCT Continue monitoring blood pressure readings at home daily for the next  several weeks and keep a log  Hypertension, Adult High blood pressure (hypertension) is when the force of blood pumping through the arteries is too strong. The arteries are the blood vessels that carry blood from the heart throughout the body. Hypertension forces the heart to work harder to pump blood and may cause arteries to become narrow or stiff. Untreated or uncontrolled hypertension can lead to a heart attack, heart failure, a stroke, kidney disease, and other problems. A blood pressure reading consists of a higher number over a lower number. Ideally, your blood pressure should be below 120/80. The first ("top") number is called the systolic pressure. It is a measure of the pressure in your arteries as your heart beats. The second ("bottom") number is called the diastolic pressure. It is a measure of the pressure in your arteries as the heart relaxes. What are the causes? The exact cause of this condition is not known. There are some conditions that result in high blood pressure. What increases the risk? Certain factors may make you more likely to develop high blood pressure. Some of  these risk factors are under your control, including: Smoking. Not getting enough exercise or physical activity. Being overweight. Having too much fat, sugar, calories, or salt (sodium) in your diet. Drinking too much alcohol. Other risk factors include: Having a personal history of heart disease, diabetes, high cholesterol, or kidney disease. Stress. Having a family history of high blood pressure and high cholesterol. Having obstructive sleep apnea. Age. The risk increases with age. What are the signs or symptoms? High blood pressure may not cause symptoms. Very high blood pressure (hypertensive crisis) may cause: Headache. Fast or irregular heartbeats (palpitations). Shortness of breath. Nosebleed. Nausea and vomiting. Vision changes. Severe chest pain, dizziness, and seizures. How is this diagnosed? This condition is diagnosed by measuring your blood pressure while you are seated, with your arm resting on a flat surface, your legs uncrossed, and your feet flat on the floor. The cuff of the blood pressure monitor will be placed directly against the skin of your upper arm at the level of your heart. Blood pressure should be measured at least twice using the same arm. Certain conditions can cause a difference in blood pressure between your right and left arms. If you have a high blood pressure reading during one visit or you have normal blood pressure with other risk factors, you may be asked to: Return on a different day to have your blood pressure checked again. Monitor your blood pressure at home for 1 week or longer. If you are diagnosed with hypertension, you may have other blood or imaging tests to help your health care provider understand your overall risk for other conditions. How is this treated? This condition is treated by making healthy lifestyle changes, such as eating healthy foods, exercising more, and reducing your alcohol intake. You may be referred for counseling on a  healthy diet and physical activity. Your health care provider may prescribe medicine if lifestyle changes are not enough to get your blood pressure under control and if: Your systolic blood pressure is above 130. Your diastolic blood pressure is above 80. Your personal target blood pressure may vary depending on your medical conditions, your age, and other factors. Follow these instructions at home: Eating and drinking  Eat a diet that is high in fiber and potassium, and low in sodium, added sugar, and fat. An example of this eating plan is called the DASH diet. DASH stands for Dietary Approaches  to Stop Hypertension. To eat this way: Eat plenty of fresh fruits and vegetables. Try to fill one half of your plate at each meal with fruits and vegetables. Eat whole grains, such as whole-wheat pasta, brown rice, or whole-grain bread. Fill about one fourth of your plate with whole grains. Eat or drink low-fat dairy products, such as skim milk or low-fat yogurt. Avoid fatty cuts of meat, processed or cured meats, and poultry with skin. Fill about one fourth of your plate with lean proteins, such as fish, chicken without skin, beans, eggs, or tofu. Avoid pre-made and processed foods. These tend to be higher in sodium, added sugar, and fat. Reduce your daily sodium intake. Many people with hypertension should eat less than 1,500 mg of sodium a day. Do not drink alcohol if: Your health care provider tells you not to drink. You are pregnant, may be pregnant, or are planning to become pregnant. If you drink alcohol: Limit how much you have to: 0-1 drink a day for women. 0-2 drinks a day for men. Know how much alcohol is in your drink. In the [place] one drink equals one 12 oz bottle of beer (355 mL), one 5 oz glass of wine (148 mL), or one 1 oz glass of hard liquor (44 mL). Lifestyle  Work with your health care provider to maintain a healthy body weight or to lose weight. Ask what an ideal weight is for  you. Get at least 30 minutes of exercise that causes your heart to beat faster (aerobic exercise) most days of the week. Activities may include walking, swimming, or biking. Include exercise to strengthen your muscles (resistance exercise), such as Pilates or lifting weights, as part of your weekly exercise routine. Try to do these types of exercises for 30 minutes at least 3 days a week. Do not use any products that contain nicotine or tobacco. These products include cigarettes, chewing tobacco, and vaping devices, such as e-cigarettes. If you need help quitting, ask your health care provider. Monitor your blood pressure at home as told by your health care provider. Keep all follow-up visits. This is important. Medicines Take over-the-counter and prescription medicines only as told by your health care provider. Follow directions carefully. Blood pressure medicines must be taken as prescribed. Do not skip doses of blood pressure medicine. Doing this puts you at risk for problems and can make the medicine less effective. Ask your health care provider about side effects or reactions to medicines that you should watch for. Contact a health care provider if you: Think you are having a reaction to a medicine you are taking. Have headaches that keep coming back (recurring). Feel dizzy. Have swelling in your ankles. Have trouble with your vision. Get help right away if you: Develop a severe headache or confusion. Have unusual weakness or numbness. Feel faint. Have severe pain in your chest or abdomen. Vomit repeatedly. Have trouble breathing. These symptoms may be an emergency. Get help right away. Call 911. Do not wait to see if the symptoms will go away. Do not drive yourself to the hospital. Summary Hypertension is when the force of blood pumping through your arteries is too strong. If this condition is not controlled, it may put you at risk for serious complications. Your personal target  blood pressure may vary depending on your medical conditions, your age, and other factors. For most people, a normal blood pressure is less than 120/80. Hypertension is treated with lifestyle changes, medicines, or a combination of both.  Lifestyle changes include losing weight, eating a healthy, low-sodium diet, exercising more, and limiting alcohol. This information is not intended to replace advice given to you by your health care provider. Make sure you discuss any questions you have with your health care provider. Document Revised: 07/01/2021 Document Reviewed: 07/01/2021 Elsevier Patient Education  2023 [employer]     Edwina Barth, MD [facility]

## 2023-01-27 NOTE — Assessment & Plan Note (Signed)
GFR 55.12 August 2022 We will repeat today Advised to stay well-hydrated and avoid NSAIDs

## 2023-01-27 NOTE — Assessment & Plan Note (Signed)
Well-controlled hypertension.  However patient thinks amlodipine is causing his chronic cough and prefers to change medication. Recommend to stop amlodipine and start valsartan HCT 160-12.5 mg daily. Advised to continue monitoring blood pressure readings at home daily for the next couple of weeks and contact the office if numbers persistently abnormal Dietary approaches to stop hypertension discussed Follow-up in 3 months

## 2023-08-17 ENCOUNTER — Telehealth: Payer: Self-pay | Admitting: Emergency Medicine

## 2023-08-17 NOTE — Telephone Encounter (Signed)
Prescription Request  08/17/2023  LOV: 01/27/2023  What is the name of the medication or equipment? Valsartan  Have you contacted your pharmacy to request a refill? Yes   Which pharmacy would you like this sent to?  [id number] - [place] ([place]), [place] - [address] [place] ([place]) [place] [address] Phone: [phone] Fax: [phone]    Patient notified that their request is being sent to the clinical staff for review and that they should receive a response within 2 business days.   Please advise at Mobile There is no such number on file (mobile).

## 2023-08-17 NOTE — Telephone Encounter (Signed)
Patient request too soon.

## 2024-02-03 ENCOUNTER — Ambulatory Visit (INDEPENDENT_AMBULATORY_CARE_PROVIDER_SITE_OTHER)

## 2024-02-03 ENCOUNTER — Ambulatory Visit: Payer: Self-pay

## 2024-02-03 ENCOUNTER — Encounter: Payer: Self-pay | Admitting: Emergency Medicine

## 2024-02-03 ENCOUNTER — Ambulatory Visit: Admitting: Emergency Medicine

## 2024-02-03 VITALS — BP 160/104 | HR 88 | Temp 98.6°F | Ht 68.0 in | Wt 190.0 lb

## 2024-02-03 DIAGNOSIS — I10 Essential (primary) hypertension: Secondary | ICD-10-CM | POA: Diagnosis not present

## 2024-02-03 DIAGNOSIS — M7918 Myalgia, other site: Secondary | ICD-10-CM | POA: Insufficient documentation

## 2024-02-03 MED ORDER — TRAMADOL HCL 50 MG PO TABS
50.0000 mg | ORAL_TABLET | Freq: Three times a day (TID) | ORAL | 1 refills | Status: AC | PRN
Start: 2024-02-03 — End: 2024-02-08

## 2024-02-03 MED ORDER — VALSARTAN-HYDROCHLOROTHIAZIDE 160-12.5 MG PO TABS
1.0000 | ORAL_TABLET | Freq: Every day | ORAL | 3 refills | Status: AC
Start: 2024-02-03 — End: ?

## 2024-02-03 NOTE — Progress Notes (Signed)
 Barry Miller 53 y.o.   Chief Complaint  Patient presents with   Back Pain    Patient states started a couple of days ago, patient thinks it may be from doing push ups. He woke up with the pain. Mainly on the right upper shoulder. Pt is taking a muscle relaxer that he had left from his shoulder surgery and ibuprofen,not helping      HISTORY OF PRESENT ILLNESS: This is a 53 y.o. male complaining of pain to right upper back for the past couple days not responding to muscle relaxer or ibuprofen Thinks pain is from doing push-ups History of hypertension.  Off medication for 2 weeks.  Back Pain Pertinent negatives include no abdominal pain, chest pain, dysuria, fever or headaches.     Prior to Admission medications   Medication Sig Start Date End Date Taking? Authorizing Provider  traMADol (ULTRAM) 50 MG tablet Take 1 tablet (50 mg total) by mouth every 8 (eight) hours as needed for up to 5 days. 02/03/24 02/08/24 Yes Deon Ivey, Isidro Margo, MD  valsartan -hydrochlorothiazide  (DIOVAN -HCT) 160-12.5 MG tablet Take 1 tablet by mouth daily. 01/27/23  Yes Suhaila Troiano, Isidro Margo, MD  rosuvastatin  (CRESTOR ) 10 MG tablet Take 1 tablet (10 mg total) by mouth daily. Patient not taking: Reported on 02/03/2024 08/18/22   Elvira Hammersmith, MD    No Known Allergies  Patient Active Problem List   Diagnosis Date Noted   Chronic cough 01/27/2023   Neutropenia (HCC) 01/27/2023   Decreased GFR 01/27/2023   Dyslipidemia 08/18/2022   Essential hypertension 08/17/2022    Past Medical History:  Diagnosis Date   GERD (gastroesophageal reflux disease)     History reviewed. No pertinent surgical history.  Social History   Socioeconomic History   Marital status: Single    Spouse name: Not on file   Number of children: Not on file   Years of education: Not on file   Highest education level: Not on file  Occupational History   Not on file  Tobacco Use   Smoking status: Never   Smokeless  tobacco: Never  Substance and Sexual Activity   Alcohol use: Yes   Drug use: No   Sexual activity: Not on file  Other Topics Concern   Not on file  Social History Narrative   Not on file   Social Drivers of Health   Financial Resource Strain: Not on file  Food Insecurity: Not on file  Transportation Needs: Not on file  Physical Activity: Not on file  Stress: Not on file  Social Connections: Not on file  Intimate Partner Violence: Not on file    History reviewed. No pertinent family history.   Review of Systems  Constitutional: Negative.  Negative for chills and fever.  HENT: Negative.  Negative for congestion and sore throat.   Respiratory: Negative.  Negative for cough and shortness of breath.   Cardiovascular: Negative.  Negative for chest pain and palpitations.  Gastrointestinal:  Negative for abdominal pain, diarrhea, nausea and vomiting.  Genitourinary: Negative.  Negative for dysuria and hematuria.  Musculoskeletal:  Positive for back pain.  Skin: Negative.  Negative for rash.  Neurological:  Negative for dizziness and headaches.  All other systems reviewed and are negative.   Vitals:   02/03/24 1603  BP: (!) 160/104  Pulse: 88  Temp: 98.6 F (37 C)  SpO2: 95%    Physical Exam Vitals reviewed.  Constitutional:      Appearance: Normal appearance.  HENT:  Head: Normocephalic.     Mouth/Throat:     Mouth: Mucous membranes are moist.     Pharynx: Oropharynx is clear.  Eyes:     Extraocular Movements: Extraocular movements intact.     Pupils: Pupils are equal, round, and reactive to light.  Cardiovascular:     Rate and Rhythm: Normal rate and regular rhythm.     Pulses: Normal pulses.     Heart sounds: Normal heart sounds.  Pulmonary:     Effort: Pulmonary effort is normal.     Breath sounds: Normal breath sounds.  Musculoskeletal:     Cervical back: No tenderness.     Comments: Positive tenderness right trapezius muscle  Lymphadenopathy:      Cervical: No cervical adenopathy.  Skin:    General: Skin is warm and dry.     Capillary Refill: Capillary refill takes less than 2 seconds.  Neurological:     General: No focal deficit present.     Mental Status: He is alert and oriented to person, place, and time.  Psychiatric:        Mood and Affect: Mood normal.        Behavior: Behavior normal.      ASSESSMENT & PLAN: A total of 45 minutes was spent with the patient and counseling/coordination of care regarding preparing for this visit, review of most recent office visit notes, review of multiple chronic medical conditions and their management, cardiovascular risks associated with uncontrolled hypertension, review of chest x-ray images done today, review of all medications, review of most recent bloodwork results, review of health maintenance items, education on nutrition, prognosis, documentation, and need for follow up.   Problem List Items Addressed This Visit       Cardiovascular and Mediastinum   Uncontrolled hypertension - Primary   BP Readings from Last 3 Encounters:  02/03/24 (!) 160/104  01/27/23 138/86  08/17/22 (!) 178/98  Off medication for 2 weeks Cardiovascular risks associated with hypertension discussed Asymptomatic.  Recommend chest x-ray today. Recommend to restart valsartan  HCT 160-12.5 mg daily Advised to monitor blood pressure readings at home daily for the next several weeks and contact the office if numbers persistently abnormal ED precautions given       Relevant Medications   valsartan -hydrochlorothiazide  (DIOVAN -HCT) 160-12.5 MG tablet   Other Relevant Orders   DG Chest 2 View     Other   Musculoskeletal pain   Related to workout exercises, push-ups. Pain management discussed Advised to take Tylenol for mild to moderate pain and tramadol for moderate to severe pain      Relevant Medications   traMADol (ULTRAM) 50 MG tablet   Patient Instructions  Hypertension, Adult High blood  pressure (hypertension) is when the force of blood pumping through the arteries is too strong. The arteries are the blood vessels that carry blood from the heart throughout the body. Hypertension forces the heart to work harder to pump blood and may cause arteries to become narrow or stiff. Untreated or uncontrolled hypertension can lead to a heart attack, heart failure, a stroke, kidney disease, and other problems. A blood pressure reading consists of a higher number over a lower number. Ideally, your blood pressure should be below 120/80. The first ("top") number is called the systolic pressure. It is a measure of the pressure in your arteries as your heart beats. The second ("bottom") number is called the diastolic pressure. It is a measure of the pressure in your arteries as the heart relaxes. What are  the causes? The exact cause of this condition is not known. There are some conditions that result in high blood pressure. What increases the risk? Certain factors may make you more likely to develop high blood pressure. Some of these risk factors are under your control, including: Smoking. Not getting enough exercise or physical activity. Being overweight. Having too much fat, sugar, calories, or salt (sodium) in your diet. Drinking too much alcohol. Other risk factors include: Having a personal history of heart disease, diabetes, high cholesterol, or kidney disease. Stress. Having a family history of high blood pressure and high cholesterol. Having obstructive sleep apnea. Age. The risk increases with age. What are the signs or symptoms? High blood pressure may not cause symptoms. Very high blood pressure (hypertensive crisis) may cause: Headache. Fast or irregular heartbeats (palpitations). Shortness of breath. Nosebleed. Nausea and vomiting. Vision changes. Severe chest pain, dizziness, and seizures. How is this diagnosed? This condition is diagnosed by measuring your blood pressure  while you are seated, with your arm resting on a flat surface, your legs uncrossed, and your feet flat on the floor. The cuff of the blood pressure monitor will be placed directly against the skin of your upper arm at the level of your heart. Blood pressure should be measured at least twice using the same arm. Certain conditions can cause a difference in blood pressure between your right and left arms. If you have a high blood pressure reading during one visit or you have normal blood pressure with other risk factors, you may be asked to: Return on a different day to have your blood pressure checked again. Monitor your blood pressure at home for 1 week or longer. If you are diagnosed with hypertension, you may have other blood or imaging tests to help your health care provider understand your overall risk for other conditions. How is this treated? This condition is treated by making healthy lifestyle changes, such as eating healthy foods, exercising more, and reducing your alcohol intake. You may be referred for counseling on a healthy diet and physical activity. Your health care provider may prescribe medicine if lifestyle changes are not enough to get your blood pressure under control and if: Your systolic blood pressure is above 130. Your diastolic blood pressure is above 80. Your personal target blood pressure may vary depending on your medical conditions, your age, and other factors. Follow these instructions at home: Eating and drinking  Eat a diet that is high in fiber and potassium, and low in sodium, added sugar, and fat. An example of this eating plan is called the DASH diet. DASH stands for Dietary Approaches to Stop Hypertension. To eat this way: Eat plenty of fresh fruits and vegetables. Try to fill one half of your plate at each meal with fruits and vegetables. Eat whole grains, such as whole-wheat pasta, brown rice, or whole-grain bread. Fill about one fourth of your plate with whole  grains. Eat or drink low-fat dairy products, such as skim milk or low-fat yogurt. Avoid fatty cuts of meat, processed or cured meats, and poultry with skin. Fill about one fourth of your plate with lean proteins, such as fish, chicken without skin, beans, eggs, or tofu. Avoid pre-made and processed foods. These tend to be higher in sodium, added sugar, and fat. Reduce your daily sodium intake. Many people with hypertension should eat less than 1,500 mg of sodium a day. Do not drink alcohol if: Your health care provider tells you not to drink. You are  pregnant, may be pregnant, or are planning to become pregnant. If you drink alcohol: Limit how much you have to: 0-1 drink a day for women. 0-2 drinks a day for men. Know how much alcohol is in your drink. In the [place] one drink equals one 12 oz bottle of beer (355 mL), one 5 oz glass of wine (148 mL), or one 1 oz glass of hard liquor (44 mL). Lifestyle  Work with your health care provider to maintain a healthy body weight or to lose weight. Ask what an ideal weight is for you. Get at least 30 minutes of exercise that causes your heart to beat faster (aerobic exercise) most days of the week. Activities may include walking, swimming, or biking. Include exercise to strengthen your muscles (resistance exercise), such as Pilates or lifting weights, as part of your weekly exercise routine. Try to do these types of exercises for 30 minutes at least 3 days a week. Do not use any products that contain nicotine or tobacco. These products include cigarettes, chewing tobacco, and vaping devices, such as e-cigarettes. If you need help quitting, ask your health care provider. Monitor your blood pressure at home as told by your health care provider. Keep all follow-up visits. This is important. Medicines Take over-the-counter and prescription medicines only as told by your health care provider. Follow directions carefully. Blood pressure medicines must be taken  as prescribed. Do not skip doses of blood pressure medicine. Doing this puts you at risk for problems and can make the medicine less effective. Ask your health care provider about side effects or reactions to medicines that you should watch for. Contact a health care provider if you: Think you are having a reaction to a medicine you are taking. Have headaches that keep coming back (recurring). Feel dizzy. Have swelling in your ankles. Have trouble with your vision. Get help right away if you: Develop a severe headache or confusion. Have unusual weakness or numbness. Feel faint. Have severe pain in your chest or abdomen. Vomit repeatedly. Have trouble breathing. These symptoms may be an emergency. Get help right away. Call 911. Do not wait to see if the symptoms will go away. Do not drive yourself to the hospital. Summary Hypertension is when the force of blood pumping through your arteries is too strong. If this condition is not controlled, it may put you at risk for serious complications. Your personal target blood pressure may vary depending on your medical conditions, your age, and other factors. For most people, a normal blood pressure is less than 120/80. Hypertension is treated with lifestyle changes, medicines, or a combination of both. Lifestyle changes include losing weight, eating a healthy, low-sodium diet, exercising more, and limiting alcohol. This information is not intended to replace advice given to you by your health care provider. Make sure you discuss any questions you have with your health care provider. Document Revised: 07/01/2021 Document Reviewed: 07/01/2021 Elsevier Patient Education  2024 [employer]     Maryagnes Small, MD [facility]

## 2024-02-03 NOTE — Assessment & Plan Note (Signed)
 BP Readings from Last 3 Encounters:  02/03/24 (!) 160/104  01/27/23 138/86  08/17/22 (!) 178/98  Off medication for 2 weeks Cardiovascular risks associated with hypertension discussed Asymptomatic.  Recommend chest x-ray today. Recommend to restart valsartan  HCT 160-12.5 mg daily Advised to monitor blood pressure readings at home daily for the next several weeks and contact the office if numbers persistently abnormal ED precautions given

## 2024-02-03 NOTE — Telephone Encounter (Signed)
 Copied from CRM [id number]. Topic: Clinical - Red Word Triage >> Feb 03, 2024  7:48 AM [place] A wrote: [facility] that prompted transfer to Nurse Triage: Patient called because he thinks he pulled a muscle in his back-pain is 7 constant 6 or 7.   Chief Complaint: Back Pain Symptoms: pain, off and on tingling Frequency: x 2 days Pertinent Negatives: Patient denies ---- Disposition: [] ED /[] Urgent Care (no appt availability in office) / [x] Appointment(In office/virtual)/ []  [facility]/ [] Home Care/ [] Refused Recommended Disposition /[] [facility]/ []  Follow-up with PCP Additional Notes: Patient called and advised that he was doing some push ups a few days ago and thinks he pulled a muscle on the top of his right side of his back. Patient states that he was doing push ups 2 days ago and thinks that he might have pulled a muscle. He states sometimes there is a tingling down his right arm as well off and on. Appointment made for today with his PCP at 4 PM. Patient is advised that if anything worsens to go to the Emergency Room. Patient verbalized understanding.   Reason for Disposition  [1] Numbness in an arm or hand (i.e., loss of sensation) AND [2] upper back pain  Answer Assessment - Initial Assessment Questions 1. ONSET: "When did the pain begin?"      2 days ago 2. LOCATION: "Where does it hurt?" (upper, mid or lower back)     Upper right side of back 3. SEVERITY: "How bad is the pain?"  (e.g., Scale 1-10; mild, moderate, or severe)   - MILD (1-3): Doesn't interfere with normal activities.    - MODERATE (4-7): Interferes with normal activities or awakens from sleep.    - SEVERE (8-10): Excruciating pain, unable to do any normal activities.      6-7 4. PATTERN: "Is the pain constant?" (e.g., yes, no; constant, intermittent)      Constant 5. RADIATION: "Does the pain shoot into your legs or somewhere else?"     Down right arm some and patient states some tingling  in right arm off and on 6. CAUSE:  "What do you think is causing the back pain?"      Doing push ups two days ago--patient thinks he might have pulled a muscle 7. BACK OVERUSE:  "Any recent lifting of heavy objects, strenuous work or exercise?"     Push ups two days ago 8. MEDICINES: "What have you taken so far for the pain?" (e.g., nothing, acetaminophen, NSAIDS)     --- 9. NEUROLOGIC SYMPTOMS: "Do you have any weakness, numbness, or problems with bowel/bladder control?"     ---- 10. OTHER SYMPTOMS: "Do you have any other symptoms?" (e.g., fever, abdomen pain, burning with urination, blood in urine)       Some tingling down right arm off and on  Protocols used: Back Pain-A-AH

## 2024-02-03 NOTE — Assessment & Plan Note (Signed)
 Related to workout exercises, push-ups. Pain management discussed Advised to take Tylenol for mild to moderate pain and tramadol for moderate to severe pain

## 2024-02-03 NOTE — Patient Instructions (Signed)
 Hypertension, Adult High blood pressure (hypertension) is when the force of blood pumping through the arteries is too strong. The arteries are the blood vessels that carry blood from the heart throughout the body. Hypertension forces the heart to work harder to pump blood and may cause arteries to become narrow or stiff. Untreated or uncontrolled hypertension can lead to a heart attack, heart failure, a stroke, kidney disease, and other problems. A blood pressure reading consists of a higher number over a lower number. Ideally, your blood pressure should be below 120/80. The first ("top") number is called the systolic pressure. It is a measure of the pressure in your arteries as your heart beats. The second ("bottom") number is called the diastolic pressure. It is a measure of the pressure in your arteries as the heart relaxes. What are the causes? The exact cause of this condition is not known. There are some conditions that result in high blood pressure. What increases the risk? Certain factors may make you more likely to develop high blood pressure. Some of these risk factors are under your control, including: Smoking. Not getting enough exercise or physical activity. Being overweight. Having too much fat, sugar, calories, or salt (sodium) in your diet. Drinking too much alcohol. Other risk factors include: Having a personal history of heart disease, diabetes, high cholesterol, or kidney disease. Stress. Having a family history of high blood pressure and high cholesterol. Having obstructive sleep apnea. Age. The risk increases with age. What are the signs or symptoms? High blood pressure may not cause symptoms. Very high blood pressure (hypertensive crisis) may cause: Headache. Fast or irregular heartbeats (palpitations). Shortness of breath. Nosebleed. Nausea and vomiting. Vision changes. Severe chest pain, dizziness, and seizures. How is this diagnosed? This condition is diagnosed by  measuring your blood pressure while you are seated, with your arm resting on a flat surface, your legs uncrossed, and your feet flat on the floor. The cuff of the blood pressure monitor will be placed directly against the skin of your upper arm at the level of your heart. Blood pressure should be measured at least twice using the same arm. Certain conditions can cause a difference in blood pressure between your right and left arms. If you have a high blood pressure reading during one visit or you have normal blood pressure with other risk factors, you may be asked to: Return on a different day to have your blood pressure checked again. Monitor your blood pressure at home for 1 week or longer. If you are diagnosed with hypertension, you may have other blood or imaging tests to help your health care provider understand your overall risk for other conditions. How is this treated? This condition is treated by making healthy lifestyle changes, such as eating healthy foods, exercising more, and reducing your alcohol intake. You may be referred for counseling on a healthy diet and physical activity. Your health care provider may prescribe medicine if lifestyle changes are not enough to get your blood pressure under control and if: Your systolic blood pressure is above 130. Your diastolic blood pressure is above 80. Your personal target blood pressure may vary depending on your medical conditions, your age, and other factors. Follow these instructions at home: Eating and drinking  Eat a diet that is high in fiber and potassium, and low in sodium, added sugar, and fat. An example of this eating plan is called the DASH diet. DASH stands for Dietary Approaches to Stop Hypertension. To eat this way: Eat  plenty of fresh fruits and vegetables. Try to fill one half of your plate at each meal with fruits and vegetables. Eat whole grains, such as whole-wheat pasta, brown rice, or whole-grain bread. Fill about one  fourth of your plate with whole grains. Eat or drink low-fat dairy products, such as skim milk or low-fat yogurt. Avoid fatty cuts of meat, processed or cured meats, and poultry with skin. Fill about one fourth of your plate with lean proteins, such as fish, chicken without skin, beans, eggs, or tofu. Avoid pre-made and processed foods. These tend to be higher in sodium, added sugar, and fat. Reduce your daily sodium intake. Many people with hypertension should eat less than 1,500 mg of sodium a day. Do not drink alcohol if: Your health care provider tells you not to drink. You are pregnant, may be pregnant, or are planning to become pregnant. If you drink alcohol: Limit how much you have to: 0-1 drink a day for women. 0-2 drinks a day for men. Know how much alcohol is in your drink. In the [place] one drink equals one 12 oz bottle of beer (355 mL), one 5 oz glass of wine (148 mL), or one 1 oz glass of hard liquor (44 mL). Lifestyle  Work with your health care provider to maintain a healthy body weight or to lose weight. Ask what an ideal weight is for you. Get at least 30 minutes of exercise that causes your heart to beat faster (aerobic exercise) most days of the week. Activities may include walking, swimming, or biking. Include exercise to strengthen your muscles (resistance exercise), such as Pilates or lifting weights, as part of your weekly exercise routine. Try to do these types of exercises for 30 minutes at least 3 days a week. Do not use any products that contain nicotine or tobacco. These products include cigarettes, chewing tobacco, and vaping devices, such as e-cigarettes. If you need help quitting, ask your health care provider. Monitor your blood pressure at home as told by your health care provider. Keep all follow-up visits. This is important. Medicines Take over-the-counter and prescription medicines only as told by your health care provider. Follow directions carefully. Blood  pressure medicines must be taken as prescribed. Do not skip doses of blood pressure medicine. Doing this puts you at risk for problems and can make the medicine less effective. Ask your health care provider about side effects or reactions to medicines that you should watch for. Contact a health care provider if you: Think you are having a reaction to a medicine you are taking. Have headaches that keep coming back (recurring). Feel dizzy. Have swelling in your ankles. Have trouble with your vision. Get help right away if you: Develop a severe headache or confusion. Have unusual weakness or numbness. Feel faint. Have severe pain in your chest or abdomen. Vomit repeatedly. Have trouble breathing. These symptoms may be an emergency. Get help right away. Call 911. Do not wait to see if the symptoms will go away. Do not drive yourself to the hospital. Summary Hypertension is when the force of blood pumping through your arteries is too strong. If this condition is not controlled, it may put you at risk for serious complications. Your personal target blood pressure may vary depending on your medical conditions, your age, and other factors. For most people, a normal blood pressure is less than 120/80. Hypertension is treated with lifestyle changes, medicines, or a combination of both. Lifestyle changes include losing weight, eating a healthy,  low-sodium diet, exercising more, and limiting alcohol. This information is not intended to replace advice given to you by your health care provider. Make sure you discuss any questions you have with your health care provider. Document Revised: 07/01/2021 Document Reviewed: 07/01/2021 Elsevier Patient Education  2024 [employer].

## 2024-02-04 ENCOUNTER — Ambulatory Visit: Payer: Self-pay | Admitting: Emergency Medicine

## 2024-02-09 ENCOUNTER — Ambulatory Visit: Payer: Self-pay

## 2024-02-09 NOTE — Telephone Encounter (Signed)
 FYI Only or Action Required?: FYI only for provider  Patient was last seen in primary care on 02/03/2024 by Barry Hammersmith, MD. Called Nurse Triage reporting Shoulder Pain. Symptoms began a week ago. Interventions attempted: OTC medications: tramadol  and Prescription medications: ibuprofen. Symptoms are: gradually improving.  Triage Disposition: See Physician Within 24 Hours  Patient/caregiver understands and will follow disposition?: Yes                             Copied from CRM [id number]. Topic: Clinical - Red Word Triage >> Feb 09, 2024  2:42 PM [name] wrote: Red Word that prompted transfer to Nurse Triage: Right shoulder pain right above shoulder blade and going down his arm. Experiencing tingling in fingers. Reason for Disposition  Numbness (i.e., loss of sensation) in hand or fingers  Answer Assessment - Initial Assessment Questions 1. ONSET: "When did the pain start?"     States this has been ongoing for about a week, states he was seen in the office last Thursday 2. LOCATION: "Where is the pain located?"     Right 3. PAIN: "How bad is the pain?" (Scale 1-10; or mild, moderate, severe)   - MILD (1-3): doesn't interfere with normal activities   - MODERATE (4-7): interferes with normal activities (e.g., work or school) or awakens from sleep   - SEVERE (8-10): excruciating pain, unable to do any normal activities, unable to move arm at all due to pain     States pain seems to be a little better than last week, rates pain 3-4, states pain has been constant 4. WORK OR EXERCISE: "Has there been any recent work or exercise that involved this part of the body?"     States he was doing push-ups the night before, but this is not abnormal 5. CAUSE: "What do you think is causing the shoulder pain?"     Unsure 6. OTHER SYMPTOMS: "Do you have any other symptoms?" (e.g., neck pain, swelling, rash, fever, numbness, weakness)     Tingling in fingers- "pins and  needles" rather than loss of sensation, denies discoloration, states he still has ROM in this arm, denies swelling  Protocols used: Shoulder Pain-A-AH

## 2024-02-09 NOTE — Telephone Encounter (Signed)
Patient scheduled for 06/05

## 2024-02-10 ENCOUNTER — Ambulatory Visit: Admitting: Emergency Medicine

## 2024-02-10 ENCOUNTER — Encounter: Payer: Self-pay | Admitting: Emergency Medicine

## 2024-02-10 VITALS — BP 108/80 | HR 85 | Temp 98.0°F | Ht 68.0 in | Wt 184.4 lb

## 2024-02-10 DIAGNOSIS — S46811A Strain of other muscles, fascia and tendons at shoulder and upper arm level, right arm, initial encounter: Secondary | ICD-10-CM | POA: Diagnosis not present

## 2024-02-10 DIAGNOSIS — I10 Essential (primary) hypertension: Secondary | ICD-10-CM

## 2024-02-10 DIAGNOSIS — M7918 Myalgia, other site: Secondary | ICD-10-CM | POA: Diagnosis not present

## 2024-02-10 MED ORDER — MELOXICAM 15 MG PO TABS
15.0000 mg | ORAL_TABLET | Freq: Every day | ORAL | 1 refills | Status: AC
Start: 2024-02-10 — End: 2024-02-20

## 2024-02-10 NOTE — Assessment & Plan Note (Signed)
 Related to workout exercises, push-ups. Pain management discussed Recommend starting meloxicam 50 mg daily for the next 10 days Referral for sports medicine placed today.  May benefit from physical therapy.

## 2024-02-10 NOTE — Assessment & Plan Note (Signed)
 Active and affecting quality of life Unknown trigger but most likely recent exercise routine Pain management discussed Tramadol  not helping much Recommend meloxicam 15 mg daily for the next 10 days Recommend evaluation by sports medicine.  May benefit from physical therapy. Referral placed today.

## 2024-02-10 NOTE — Patient Instructions (Signed)
 Muscle Strain A muscle strain, or pulled muscle, happens when a muscle is stretched beyond its normal length. This can tear some muscle fibers and cause pain. Usually, it takes 1-2 weeks to heal from a muscle strain. Full healing normally takes 5-6 weeks. What are the causes? This condition is caused when a sudden force is placed on a muscle and stretches it too far. This can happen with a fall, while lifting, or during sports. What increases the risk? You are more likely to develop a muscle strain if you are an athlete or you do a lot of physical activity. What are the signs or symptoms? Pain. Tenderness. Bruising. Swelling. Trouble using the muscle. How is this treated? This condition is first treated with PRICE therapy. This involves: Protecting your muscle from being injured again. Resting your injured muscle. Icing your injured muscle. Putting pressure (compression) on your injured muscle. This may be done with a splint or elastic bandage. Raising (elevating) your injured muscle. Your doctor may also recommend medicine for pain. Follow these instructions at home: If you have a splint that can be taken off: Wear the splint as told by your doctor. Take it off only as told by your doctor. Check the skin around the splint every day. Tell your doctor if you see problems. Loosen the splint if your fingers or toes: Tingle. Become numb. Turn cold and blue. Keep the splint clean. If the splint is not waterproof: Do not let it get wet. Cover it with a watertight covering when you take a bath or a shower. Managing pain, stiffness, and swelling  If told, put ice on your injured area. To do this: If you have a removable splint, take it off as told by your doctor. Put ice in a plastic bag. Place a towel between your skin and the bag. Leave the ice on for 20 minutes, 2-3 times a day. Take off the ice if your skin turns bright red. This is very important. If you cannot feel pain, heat,  or cold, you have a greater risk of damage to the area. Move your fingers or toes often. Raise the injured area above the level of your heart while you are sitting or lying down. Wear an elastic bandage as told by your doctor. Make sure it is not too tight. General instructions Take over-the-counter and prescription medicines only as told by your doctor. This may include: Medicines for pain and swelling that are taken by mouth or put on the skin. Medicines to help relax your muscles. Limit your activity. Rest your injured muscle as told by your doctor. Your doctor may say that gentle movements are okay. If physical therapy was prescribed, do exercises as told by your doctor. Do not put pressure on any part of the splint until it is fully hardened. This may take many hours. Do not smoke or use any products that contain nicotine or tobacco. If you need help quitting, ask your doctor. Ask your doctor when it is safe to drive if you have a splint. Keep all follow-up visits. How is this prevented? Warm up before you exercise. This helps to prevent more muscle strains. Contact a doctor if: You have more pain or swelling in the injured area. Get help right away if: You have any of these problems in your injured area: Numbness. Tingling. Less strength than normal. Summary A muscle strain is an injury that happens when a muscle is stretched beyond normal length. This condition is first treated with PRICE  therapy. This includes protecting, resting, icing, adding pressure, and raising your injury. Limit your activity. Rest your injured muscle as told by your doctor. Your doctor may say that gentle movements are okay. Warm up before you exercise. This helps to prevent more muscle strains. This information is not intended to replace advice given to you by your health care provider. Make sure you discuss any questions you have with your health care provider. Document Revised: 12/28/2022 Document  Reviewed: 11/11/2020 Elsevier Patient Education  2024 [employer].

## 2024-02-10 NOTE — Progress Notes (Signed)
 Barry Miller 53 y.o.   Chief Complaint  Patient presents with   Shoulder Pain    Pain in right shoulder the day after exercising. Pain for the last two weeks. Sometimes pain radiating into arm. Does note past rotator cuff surgery on that shoulder. Notes tramadol  had not helped.    HISTORY OF PRESENT ILLNESS: This is a 53 y.o. male A1A complaining of pain to right trapezius area for several weeks not responding to over-the-counter analgesics Seen by me on 02/03/2024.  Tramadol  prescribed but not helping much. Not sure about trigger.  Probably push-ups he was doing. Has history of right shoulder surgery for rotator cuff injury but is able to move shoulder pretty good without any pain or restriction. No other associated symptoms BP Readings from Last 3 Encounters:  02/03/24 (!) 160/104  01/27/23 138/86  08/17/22 (!) 178/98     Shoulder Pain  Pertinent negatives include no fever.     Prior to Admission medications   Medication Sig Start Date End Date Taking? Authorizing Provider  valsartan -hydrochlorothiazide  (DIOVAN -HCT) 160-12.5 MG tablet Take 1 tablet by mouth daily. 02/03/24  Yes Kiffany Schelling, Isidro Margo, MD  rosuvastatin  (CRESTOR ) 10 MG tablet Take 1 tablet (10 mg total) by mouth daily. Patient not taking: Reported on 02/10/2024 08/18/22   Elvira Hammersmith, MD    No Known Allergies  Patient Active Problem List   Diagnosis Date Noted   Musculoskeletal pain 02/03/2024   Chronic cough 01/27/2023   Neutropenia (HCC) 01/27/2023   Decreased GFR 01/27/2023   Dyslipidemia 08/18/2022   Uncontrolled hypertension 08/17/2022    Past Medical History:  Diagnosis Date   GERD (gastroesophageal reflux disease)     History reviewed. No pertinent surgical history.  Social History   Socioeconomic History   Marital status: Single    Spouse name: Not on file   Number of children: Not on file   Years of education: Not on file   Highest education level: Not on file   Occupational History   Not on file  Tobacco Use   Smoking status: Never   Smokeless tobacco: Never  Substance and Sexual Activity   Alcohol use: Yes   Drug use: No   Sexual activity: Not on file  Other Topics Concern   Not on file  Social History Narrative   Not on file   Social Drivers of Health   Financial Resource Strain: Not on file  Food Insecurity: Not on file  Transportation Needs: Not on file  Physical Activity: Not on file  Stress: Not on file  Social Connections: Not on file  Intimate Partner Violence: Not on file    History reviewed. No pertinent family history.   Review of Systems  Constitutional: Negative.  Negative for chills and fever.  HENT: Negative.  Negative for congestion and sore throat.   Respiratory: Negative.  Negative for cough and shortness of breath.   Cardiovascular: Negative.  Negative for chest pain and palpitations.  Gastrointestinal:  Negative for abdominal pain, diarrhea, nausea and vomiting.  Genitourinary: Negative.  Negative for dysuria and hematuria.  Musculoskeletal:  Positive for back pain.  Skin: Negative.  Negative for rash.  Neurological: Negative.  Negative for dizziness and headaches.  All other systems reviewed and are negative.   Today's Vitals   02/10/24 1048  BP: 108/80  Pulse: 85  Temp: 98 F (36.7 C)  SpO2: 97%  Weight: 184 lb 6.4 oz (83.6 kg)  Height: 5\' 8"  (1.727 m)   Body mass index  is 28.04 kg/m.   Physical Exam Vitals reviewed.  Constitutional:      Appearance: Normal appearance.  HENT:     Head: Normocephalic.  Eyes:     Extraocular Movements: Extraocular movements intact.  Cardiovascular:     Rate and Rhythm: Normal rate and regular rhythm.     Pulses: Normal pulses.     Heart sounds: Normal heart sounds.  Pulmonary:     Effort: Pulmonary effort is normal.     Breath sounds: Normal breath sounds.  Musculoskeletal:     Cervical back: No tenderness.     Comments: Muscle tenderness and  spasm trapezius area  Lymphadenopathy:     Cervical: No cervical adenopathy.  Skin:    General: Skin is warm and dry.     Capillary Refill: Capillary refill takes less than 2 seconds.  Neurological:     General: No focal deficit present.     Mental Status: He is alert and oriented to person, place, and time.  Psychiatric:        Mood and Affect: Mood normal.        Behavior: Behavior normal.      ASSESSMENT & PLAN: A total of 40 minutes was spent with the patient and counseling/coordination of care regarding preparing for this visit, review of most recent office visit notes, diagnosis of hypertension and cardiovascular risks associated with this condition, review of all medications, diagnosis of trapezius muscle strain and need for sports medicine evaluation and possible physical therapy, pain management, prognosis, documentation and need for follow-up.  Problem List Items Addressed This Visit       Cardiovascular and Mediastinum   Essential hypertension   Well-controlled hypertension today Restarted valsartan  HCT 160-12.5 mg with good results Cardiovascular risks associated with hypertension discussed        Musculoskeletal and Integument   Strain of right trapezius muscle - Primary   Active and affecting quality of life Unknown trigger but most likely recent exercise routine Pain management discussed Tramadol  not helping much Recommend meloxicam 15 mg daily for the next 10 days Recommend evaluation by sports medicine.  May benefit from physical therapy. Referral placed today.      Relevant Medications   meloxicam (MOBIC) 15 MG tablet   Other Relevant Orders   Ambulatory referral to Sports Medicine     Other   Musculoskeletal pain   Related to workout exercises, push-ups. Pain management discussed Recommend starting meloxicam 50 mg daily for the next 10 days Referral for sports medicine placed today.  May benefit from physical therapy.      Relevant Medications    meloxicam (MOBIC) 15 MG tablet   Patient Instructions  Muscle Strain A muscle strain, or pulled muscle, happens when a muscle is stretched beyond its normal length. This can tear some muscle fibers and cause pain. Usually, it takes 1-2 weeks to heal from a muscle strain. Full healing normally takes 5-6 weeks. What are the causes? This condition is caused when a sudden force is placed on a muscle and stretches it too far. This can happen with a fall, while lifting, or during sports. What increases the risk? You are more likely to develop a muscle strain if you are an athlete or you do a lot of physical activity. What are the signs or symptoms? Pain. Tenderness. Bruising. Swelling. Trouble using the muscle. How is this treated? This condition is first treated with PRICE therapy. This involves: Protecting your muscle from being injured again. Resting your injured  muscle. Icing your injured muscle. Putting pressure (compression) on your injured muscle. This may be done with a splint or elastic bandage. Raising (elevating) your injured muscle. Your doctor may also recommend medicine for pain. Follow these instructions at home: If you have a splint that can be taken off: Wear the splint as told by your doctor. Take it off only as told by your doctor. Check the skin around the splint every day. Tell your doctor if you see problems. Loosen the splint if your fingers or toes: Tingle. Become numb. Turn cold and blue. Keep the splint clean. If the splint is not waterproof: Do not let it get wet. Cover it with a watertight covering when you take a bath or a shower. Managing pain, stiffness, and swelling  If told, put ice on your injured area. To do this: If you have a removable splint, take it off as told by your doctor. Put ice in a plastic bag. Place a towel between your skin and the bag. Leave the ice on for 20 minutes, 2-3 times a day. Take off the ice if your skin turns bright  red. This is very important. If you cannot feel pain, heat, or cold, you have a greater risk of damage to the area. Move your fingers or toes often. Raise the injured area above the level of your heart while you are sitting or lying down. Wear an elastic bandage as told by your doctor. Make sure it is not too tight. General instructions Take over-the-counter and prescription medicines only as told by your doctor. This may include: Medicines for pain and swelling that are taken by mouth or put on the skin. Medicines to help relax your muscles. Limit your activity. Rest your injured muscle as told by your doctor. Your doctor may say that gentle movements are okay. If physical therapy was prescribed, do exercises as told by your doctor. Do not put pressure on any part of the splint until it is fully hardened. This may take many hours. Do not smoke or use any products that contain nicotine or tobacco. If you need help quitting, ask your doctor. Ask your doctor when it is safe to drive if you have a splint. Keep all follow-up visits. How is this prevented? Warm up before you exercise. This helps to prevent more muscle strains. Contact a doctor if: You have more pain or swelling in the injured area. Get help right away if: You have any of these problems in your injured area: Numbness. Tingling. Less strength than normal. Summary A muscle strain is an injury that happens when a muscle is stretched beyond normal length. This condition is first treated with PRICE therapy. This includes protecting, resting, icing, adding pressure, and raising your injury. Limit your activity. Rest your injured muscle as told by your doctor. Your doctor may say that gentle movements are okay. Warm up before you exercise. This helps to prevent more muscle strains. This information is not intended to replace advice given to you by your health care provider. Make sure you discuss any questions you have with your health  care provider. Document Revised: 12/28/2022 Document Reviewed: 11/11/2020 [employer]vier Patient Education  2024 [employer]    Maryagnes Small, MD [facility]

## 2024-02-10 NOTE — Assessment & Plan Note (Signed)
 Well-controlled hypertension today Restarted valsartan  HCT 160-12.5 mg with good results Cardiovascular risks associated with hypertension discussed

## 2024-02-14 NOTE — Progress Notes (Deleted)
    Ben Jackson D.[facility] [address] [place] [address] Phone: [phone]   Assessment and Plan:     There are no diagnoses linked to this encounter.  ***   Pertinent previous records reviewed include ***    Follow Up: ***     Subjective:   I, Shresta Risden, am serving as a Neurosurgeon for Doctor Ulysees Gander  Chief Complaint: shoulder pain   HPI:   02/15/2024 Patient is a 53 year old male with shoulder pain. Patient states   Relevant Historical Information: ***  Additional pertinent review of systems negative.   Current Outpatient Medications:    meloxicam  (MOBIC ) 15 MG tablet, Take 1 tablet (15 mg total) by mouth daily for 10 days., Disp: 10 tablet, Rfl: 1   rosuvastatin  (CRESTOR ) 10 MG tablet, Take 1 tablet (10 mg total) by mouth daily. (Patient not taking: Reported on 02/10/2024), Disp: 90 tablet, Rfl: 3   valsartan -hydrochlorothiazide  (DIOVAN -HCT) 160-12.5 MG tablet, Take 1 tablet by mouth daily., Disp: 90 tablet, Rfl: 3   Objective:     There were no vitals filed for this visit.    There is no height or weight on file to calculate BMI.    Physical Exam:    ***   Electronically signed by:  Marshall Skeeter D.[facility] 7:36 AM 02/14/24

## 2024-02-15 ENCOUNTER — Ambulatory Visit: Admitting: Sports Medicine

## 2024-03-14 ENCOUNTER — Encounter: Payer: Self-pay | Admitting: Sports Medicine
